# Patient Record
Sex: Male | Born: 1935 | Race: Black or African American | Hispanic: No | Marital: Single | Smoking: Former smoker
Health system: Southern US, Community
[De-identification: ages and names within clinical notes are randomized; demographics above are authoritative.]

## PROBLEM LIST (undated history)

## (undated) DIAGNOSIS — I639 Cerebral infarction, unspecified: Secondary | ICD-10-CM

## (undated) DIAGNOSIS — I219 Acute myocardial infarction, unspecified: Secondary | ICD-10-CM

## (undated) DIAGNOSIS — M109 Gout, unspecified: Secondary | ICD-10-CM

## (undated) DIAGNOSIS — F0390 Unspecified dementia without behavioral disturbance: Secondary | ICD-10-CM

## (undated) HISTORY — PX: BACK SURGERY: SHX140

---

## 1998-07-06 ENCOUNTER — Ambulatory Visit (HOSPITAL_BASED_OUTPATIENT_CLINIC_OR_DEPARTMENT_OTHER): Admission: RE | Admit: 1998-07-06 | Discharge: 1998-07-06 | Payer: Self-pay | Admitting: Urology

## 1998-12-10 ENCOUNTER — Encounter: Payer: Self-pay | Admitting: Emergency Medicine

## 1998-12-10 ENCOUNTER — Emergency Department (HOSPITAL_COMMUNITY): Admission: EM | Admit: 1998-12-10 | Discharge: 1998-12-10 | Payer: Self-pay | Admitting: Emergency Medicine

## 1998-12-22 ENCOUNTER — Ambulatory Visit (HOSPITAL_COMMUNITY): Admission: RE | Admit: 1998-12-22 | Discharge: 1998-12-22 | Payer: Self-pay | Admitting: *Deleted

## 1998-12-22 ENCOUNTER — Encounter: Payer: Self-pay | Admitting: *Deleted

## 1999-07-19 ENCOUNTER — Ambulatory Visit (HOSPITAL_COMMUNITY): Admission: RE | Admit: 1999-07-19 | Discharge: 1999-07-19 | Payer: Self-pay | Admitting: Gastroenterology

## 2000-07-05 ENCOUNTER — Ambulatory Visit (HOSPITAL_COMMUNITY): Admission: RE | Admit: 2000-07-05 | Discharge: 2000-07-05 | Payer: Self-pay | Admitting: Interventional Cardiology

## 2000-07-13 ENCOUNTER — Encounter: Payer: Self-pay | Admitting: Interventional Cardiology

## 2000-07-13 ENCOUNTER — Encounter: Admission: RE | Admit: 2000-07-13 | Discharge: 2000-07-13 | Payer: Self-pay | Admitting: Interventional Cardiology

## 2004-12-06 ENCOUNTER — Ambulatory Visit (HOSPITAL_COMMUNITY): Admission: RE | Admit: 2004-12-06 | Discharge: 2004-12-06 | Payer: Self-pay | Admitting: Gastroenterology

## 2005-05-26 ENCOUNTER — Inpatient Hospital Stay (HOSPITAL_BASED_OUTPATIENT_CLINIC_OR_DEPARTMENT_OTHER): Admission: RE | Admit: 2005-05-26 | Discharge: 2005-05-26 | Payer: Self-pay | Admitting: Interventional Cardiology

## 2013-01-22 ENCOUNTER — Other Ambulatory Visit: Payer: Self-pay | Admitting: Internal Medicine

## 2013-01-22 DIAGNOSIS — R269 Unspecified abnormalities of gait and mobility: Secondary | ICD-10-CM

## 2013-01-22 DIAGNOSIS — F0390 Unspecified dementia without behavioral disturbance: Secondary | ICD-10-CM

## 2013-01-23 ENCOUNTER — Ambulatory Visit
Admission: RE | Admit: 2013-01-23 | Discharge: 2013-01-23 | Disposition: A | Discharge status: Home / Self Care | Payer: Medicare Other | Source: Ambulatory Visit | Attending: Internal Medicine | Admitting: Internal Medicine

## 2013-01-23 DIAGNOSIS — R269 Unspecified abnormalities of gait and mobility: Secondary | ICD-10-CM

## 2013-01-23 DIAGNOSIS — F0390 Unspecified dementia without behavioral disturbance: Secondary | ICD-10-CM

## 2013-01-23 MED ORDER — GADOBENATE DIMEGLUMINE 529 MG/ML IV SOLN
14.0000 mL | Freq: Once | INTRAVENOUS | Status: AC | PRN
  Administered 2013-01-23: 14 mL via INTRAVENOUS

## 2013-05-27 ENCOUNTER — Emergency Department (HOSPITAL_COMMUNITY): Payer: Medicare Other

## 2013-05-27 ENCOUNTER — Inpatient Hospital Stay (HOSPITAL_COMMUNITY)
Admission: EM | Admit: 2013-05-27 | Discharge: 2013-05-31 | DRG: 309 | Disposition: A | Discharge status: Skilled Nursing Facility | Payer: Medicare Other | Attending: Internal Medicine | Admitting: Internal Medicine

## 2013-05-27 ENCOUNTER — Encounter (HOSPITAL_COMMUNITY): Payer: Self-pay | Admitting: Nurse Practitioner

## 2013-05-27 DIAGNOSIS — I480 Paroxysmal atrial fibrillation: Secondary | ICD-10-CM | POA: Diagnosis present

## 2013-05-27 DIAGNOSIS — N390 Urinary tract infection, site not specified: Secondary | ICD-10-CM | POA: Diagnosis present

## 2013-05-27 DIAGNOSIS — E119 Type 2 diabetes mellitus without complications: Secondary | ICD-10-CM

## 2013-05-27 DIAGNOSIS — Z8673 Personal history of transient ischemic attack (TIA), and cerebral infarction without residual deficits: Secondary | ICD-10-CM

## 2013-05-27 DIAGNOSIS — F0390 Unspecified dementia without behavioral disturbance: Secondary | ICD-10-CM | POA: Diagnosis present

## 2013-05-27 DIAGNOSIS — M25569 Pain in unspecified knee: Secondary | ICD-10-CM

## 2013-05-27 DIAGNOSIS — Z79899 Other long term (current) drug therapy: Secondary | ICD-10-CM

## 2013-05-27 DIAGNOSIS — I2510 Atherosclerotic heart disease of native coronary artery without angina pectoris: Secondary | ICD-10-CM | POA: Diagnosis present

## 2013-05-27 DIAGNOSIS — I4891 Unspecified atrial fibrillation: Secondary | ICD-10-CM

## 2013-05-27 DIAGNOSIS — M1990 Unspecified osteoarthritis, unspecified site: Secondary | ICD-10-CM

## 2013-05-27 DIAGNOSIS — B961 Klebsiella pneumoniae [K. pneumoniae] as the cause of diseases classified elsewhere: Secondary | ICD-10-CM | POA: Diagnosis present

## 2013-05-27 DIAGNOSIS — F0150 Vascular dementia without behavioral disturbance: Secondary | ICD-10-CM | POA: Diagnosis present

## 2013-05-27 DIAGNOSIS — M79671 Pain in right foot: Secondary | ICD-10-CM

## 2013-05-27 DIAGNOSIS — I672 Cerebral atherosclerosis: Secondary | ICD-10-CM | POA: Diagnosis present

## 2013-05-27 DIAGNOSIS — N400 Enlarged prostate without lower urinary tract symptoms: Secondary | ICD-10-CM | POA: Diagnosis present

## 2013-05-27 DIAGNOSIS — R739 Hyperglycemia, unspecified: Secondary | ICD-10-CM

## 2013-05-27 DIAGNOSIS — E162 Hypoglycemia, unspecified: Secondary | ICD-10-CM

## 2013-05-27 DIAGNOSIS — D638 Anemia in other chronic diseases classified elsewhere: Secondary | ICD-10-CM | POA: Diagnosis present

## 2013-05-27 DIAGNOSIS — I252 Old myocardial infarction: Secondary | ICD-10-CM

## 2013-05-27 DIAGNOSIS — Z7982 Long term (current) use of aspirin: Secondary | ICD-10-CM

## 2013-05-27 DIAGNOSIS — E1169 Type 2 diabetes mellitus with other specified complication: Secondary | ICD-10-CM | POA: Diagnosis present

## 2013-05-27 DIAGNOSIS — M109 Gout, unspecified: Secondary | ICD-10-CM | POA: Diagnosis present

## 2013-05-27 DIAGNOSIS — Z87891 Personal history of nicotine dependence: Secondary | ICD-10-CM

## 2013-05-27 DIAGNOSIS — Z23 Encounter for immunization: Secondary | ICD-10-CM

## 2013-05-27 DIAGNOSIS — E860 Dehydration: Secondary | ICD-10-CM

## 2013-05-27 DIAGNOSIS — Z7902 Long term (current) use of antithrombotics/antiplatelets: Secondary | ICD-10-CM

## 2013-05-27 DIAGNOSIS — E876 Hypokalemia: Secondary | ICD-10-CM | POA: Diagnosis not present

## 2013-05-27 DIAGNOSIS — I10 Essential (primary) hypertension: Secondary | ICD-10-CM | POA: Diagnosis present

## 2013-05-27 DIAGNOSIS — D72829 Elevated white blood cell count, unspecified: Secondary | ICD-10-CM | POA: Diagnosis present

## 2013-05-27 HISTORY — DX: Unspecified dementia, unspecified severity, without behavioral disturbance, psychotic disturbance, mood disturbance, and anxiety: F03.90

## 2013-05-27 HISTORY — DX: Acute myocardial infarction, unspecified: I21.9

## 2013-05-27 HISTORY — DX: Gout, unspecified: M10.9

## 2013-05-27 HISTORY — DX: Cerebral infarction, unspecified: I63.9

## 2013-05-27 LAB — COMPREHENSIVE METABOLIC PANEL
ALT: 20 U/L (ref 0–53)
AST: 27 U/L (ref 0–37)
Albumin: 3.2 g/dL — ABNORMAL LOW (ref 3.5–5.2)
Alkaline Phosphatase: 84 U/L (ref 39–117)
Calcium: 10.8 mg/dL — ABNORMAL HIGH (ref 8.4–10.5)
Potassium: 4 mEq/L (ref 3.5–5.1)
Sodium: 141 mEq/L (ref 135–145)
Total Protein: 7.3 g/dL (ref 6.0–8.3)

## 2013-05-27 LAB — CBC WITH DIFFERENTIAL/PLATELET
Basophils Absolute: 0 10*3/uL (ref 0.0–0.1)
Eosinophils Absolute: 0.1 10*3/uL (ref 0.0–0.7)
Eosinophils Relative: 1 % (ref 0–5)
Lymphs Abs: 1.4 10*3/uL (ref 0.7–4.0)
MCH: 28.4 pg (ref 26.0–34.0)
MCV: 83.1 fL (ref 78.0–100.0)
Neutrophils Relative %: 80 % — ABNORMAL HIGH (ref 43–77)
Platelets: 368 10*3/uL (ref 150–400)
RBC: 4.15 MIL/uL — ABNORMAL LOW (ref 4.22–5.81)
RDW: 15.4 % (ref 11.5–15.5)
WBC: 10.6 10*3/uL — ABNORMAL HIGH (ref 4.0–10.5)

## 2013-05-27 MED ORDER — SODIUM CHLORIDE 0.9 % IV SOLN
1000.0000 mL | Freq: Once | INTRAVENOUS | Status: AC
  Administered 2013-05-27: 1000 mL via INTRAVENOUS

## 2013-05-27 NOTE — ED Provider Notes (Addendum)
CSN: [id number]     Arrival date & time 05/27/13  1805 History   First MD Initiated Contact with Patient 05/27/13 1811     Chief Complaint  Patient presents with  . Foot Pain  . Diarrhea   (Consider location/radiation/quality/duration/timing/severity/associated sxs/prior Treatment) HPI Comments: Darren Underwood is a 79 y.o. Male who presents for evaluation of right knee and foot pain, diarrhea, inability to walk, and decreased appetite for 10 days. He is here with his wife and daughter. They state that he cannot be managed at home like this. He lives alone with his wife, who is elderly. He saw his PCP, and was placed on prednisone for "gout". He completed this treatment 3 days ago. He is taking Tylenol without relief of pain. There is no reported fever or chills, nausea, vomiting, or stated abdominal pain. The patient cannot contribute to history.  Level V caveat: Dementia   Patient is a 79 y.o. male presenting with lower extremity pain and diarrhea. The history is provided by the patient, the spouse and a relative.  Foot Pain  Diarrhea   Past Medical History  Diagnosis Date  . Gout   . Stroke   . Dementia   . Myocardial infarct    History reviewed. No pertinent past surgical history. History reviewed. No pertinent family history. History  Substance Use Topics  . Smoking status: Not on file  . Smokeless tobacco: Not on file  . Alcohol Use: Not on file    Review of Systems  Unable to perform ROS Gastrointestinal: Positive for diarrhea.    Allergies  Review of patient's allergies indicates no known allergies.  Home Medications   Current Outpatient Rx  Name  Route  Sig  Dispense  Refill  . amLODipine (NORVASC) 10 MG tablet   Oral   Take 10 mg by mouth daily.         Marland Kitchen aspirin EC 81 MG tablet   Oral   Take 81 mg by mouth daily.         Marland Kitchen atorvastatin (LIPITOR) 20 MG tablet   Oral   Take 10 mg by mouth daily.         . carvedilol (COREG) 12.5 MG tablet    Oral   Take 12.5 mg by mouth 2 (two) times daily with a meal.         . clopidogrel (PLAVIX) 75 MG tablet   Oral   Take 75 mg by mouth daily.         Marland Kitchen donepezil (ARICEPT) 10 MG tablet   Oral   Take 10 mg by mouth at bedtime.         Marland Kitchen glimepiride (AMARYL) 4 MG tablet   Oral   Take 4 mg by mouth daily before breakfast.         . metFORMIN (GLUCOPHAGE) 850 MG tablet   Oral   Take 850 mg by mouth 2 (two) times daily with a meal.         . olmesartan-hydrochlorothiazide (BENICAR HCT) 40-25 MG per tablet   Oral   Take 0.5 tablets by mouth daily.         . pioglitazone (ACTOS) 45 MG tablet   Oral   Take 45 mg by mouth daily.         . potassium chloride SA (K-DUR,KLOR-CON) 20 MEQ tablet   Oral   Take 20 mEq by mouth 2 (two) times daily.         . traMADol (  ULTRAM) 50 MG tablet   Oral   Take 50 mg by mouth every 6 (six) hours as needed. For pain          BP 145/77  Pulse 80  Resp 12  SpO2 99% Physical Exam  Nursing note and vitals reviewed. Constitutional: He is oriented to person, place, and time. He appears well-developed.  Elderly, frail. He cannot sit up without assistance due to weakness.  HENT:  Head: Normocephalic and atraumatic.  Right Ear: External ear normal.  Left Ear: External ear normal.  Eyes: Conjunctivae and EOM are normal. Pupils are equal, round, and reactive to light.  Neck: Normal range of motion and phonation normal. Neck supple.  Cardiovascular: Normal rate, regular rhythm, normal heart sounds and intact distal pulses.   Pulmonary/Chest: Effort normal and breath sounds normal. He exhibits no bony tenderness.  Abdominal: Soft. Normal appearance. He exhibits no mass. There is no tenderness. There is no guarding.  Musculoskeletal:  Decreased range of motion right knee, and right foot, secondary to pain. There is mild effusion right knee. Right knee is grossly stable. Right foot is mildly swollen, forefoot, with increased warmth, and  is tender to touch. There is no significant erythema or proximal streaking.  Neurological: He is alert and oriented to person, place, and time. He has normal strength. No cranial nerve deficit or sensory deficit. He exhibits normal muscle tone. Coordination normal.  Skin: Skin is warm, dry and intact.  Psychiatric: He has a normal mood and affect. His behavior is normal. Judgment and thought content normal.    ED Course  Procedures (including critical care time)  Medications  0.9 %  sodium chloride infusion (1,000 mLs Intravenous New Bag/Given 05/27/13 1859)   Patient Vitals for the past 24 hrs:  BP Pulse Resp SpO2  05/27/13 2315 145/77 mmHg 80 12 99 %  05/27/13 2300 147/77 mmHg 90 20 98 %  05/27/13 2215 145/65 mmHg 78 16 98 %  05/27/13 2200 166/86 mmHg 81 16 100 %  05/27/13 2145 153/63 mmHg 82 16 98 %  05/27/13 2115 150/77 mmHg 91 16 99 %  05/27/13 2100 146/70 mmHg 77 22 97 %  05/27/13 2052 - 110 24 98 %  05/27/13 2051 168/69 mmHg - - -  05/27/13 2022 145/73 mmHg 82 16 100 %  05/27/13 1925 143/79 mmHg 92 - 96 %    11:29 PM-Consult complete with hospitalist. Patient case explained and discussed. He agrees to admit patient for further evaluation and treatment. Call ended at 0010  0055- I discussed the case again with Dr. Julian Reil; relative to the new-onset atrial fibrillation. He asked that I start heparin.   Date: 05/28/13  Rate: 108  Rhythm: atrial fibrillation  QRS Axis: normal  PR and QT Intervals: QT normal  ST/T Wave abnormalities: normal  PR and QRS Conduction Disutrbances:Normal QT  Narrative Interpretation:   Old EKG Reviewed: no old ECG  CRITICAL CARE Performed by: Mancel Bale L Total critical care time: 35 minutes  Critical care time was exclusive of separately billable procedures and treating other patients. Critical care was necessary to treat or prevent imminent or life-threatening deterioration. Critical care was time spent personally by me on the following  activities: development of treatment plan with patient and/or surrogate as well as nursing, discussions with consultants, evaluation of patient's response to treatment, examination of patient, obtaining history from patient or surrogate, ordering and performing treatments and interventions, ordering and review of laboratory studies, ordering and review of radiographic studies,  pulse oximetry and re-evaluation of patient's condition.   Labs Review Labs Reviewed  CBC WITH DIFFERENTIAL - Abnormal; Notable for the following:    WBC 10.6 (*)    RBC 4.15 (*)    Hemoglobin 11.8 (*)    HCT 34.5 (*)    Neutrophils Relative % 80 (*)    Neutro Abs 8.4 (*)    All other components within normal limits  COMPREHENSIVE METABOLIC PANEL - Abnormal; Notable for the following:    Glucose, Bld 321 (*)    BUN 37 (*)    Calcium 10.8 (*)    Albumin 3.2 (*)    GFR calc non Af Amer 55 (*)    GFR calc Af Amer 64 (*)    All other components within normal limits  LIPASE, BLOOD  URINALYSIS, ROUTINE W REFLEX MICROSCOPIC   Imaging Review Dg Knee 1-2 Views Left  05/27/2013   CLINICAL DATA:  Pain. Possible gout. No trauma.  EXAM: LEFT KNEE - 1-2 VIEW  COMPARISON:  None.  FINDINGS: Negative for acute fracture, subluxation, joint effusion, or osseous erosion. There is extensive atherosclerotic calcification. Mild degenerative medial compartment joint narrowing. 7 mm long oval ossification or other mineralization near the fibular head. This may be bursal or tendinous. Location not typical for gout.  IMPRESSION: 1. Negative for acute osseous abnormality. No evidence of erosive arthritis. 2. Nonspecific ossification near the fibular head, likely bursal.   Electronically Signed   By: Tiburcio Pea   On: 05/27/2013 23:10   Dg Knee 1-2 Views Right  05/27/2013   CLINICAL DATA:  Pain. Possible gout. No trauma.  EXAM: RIGHT KNEE - 1-2 VIEW  COMPARISON:  None.  FINDINGS: Negative for acute fracture, subluxation, erosion, or other  acute abnormality. Doubt significant knee joint effusion. Tricompartmental osteoarthritis, more advanced than the contralateral knee, with moderate to advanced medial compartment narrowing. Extensive atherosclerotic calcification.  IMPRESSION: 1. Negative for acute osseous abnormality. 2. Tricompartmental osteoarthritis with at least moderate medial compartment narrowing.   Electronically Signed   By: Tiburcio Pea   On: 05/27/2013 23:11   Dg Foot Complete Right  05/28/2013   CLINICAL DATA:  Foot pain and diarrhea.  EXAM: RIGHT FOOT COMPLETE - 3+ VIEW  COMPARISON:  None.  FINDINGS: Negative for fracture subluxation. No osseous erosion. Osteopenia. 1st MCP joint osteoarthritis with mild joint narrowing. Talonavicular joint degenerative change with anterior process talus large osteophyte.  IMPRESSION: Negative for acute osseous abnormality.   Electronically Signed   By: Tiburcio Pea   On: 05/28/2013 00:01    MDM   1. Dehydration   2. Hyperglycemia   3. Knee pain, unspecified laterality   4. Degenerative joint disease   5. Foot pain, right   6. New onset atrial fibrillation       Right knee pain, secondary to arthritis. Generalized malaise, and weakness, associated with decreased oral intake. Metabolic screening, consistent with dehydration, and hyperglycemia. Hyperglycemia may be due to recent treatment with prednisone. This could have caused dehydration. The patient had transient tachycardia in ED, treated with IV fluids. He is weak. He needs admission for observation, further IV fluids, and further evaluation.  Nursing Notes Reviewed/ Care Coordinated, and agree without changes. Applicable Imaging Reviewed.  Interpretation of Laboratory Data incorporated into ED treatment  Plan: Admit  Flint Melter, MD 05/28/13 0011  Flint Melter, MD 05/28/13 7829  Flint Melter, MD 05/28/13 [phone]

## 2013-05-27 NOTE — ED Notes (Signed)
Family called ems for gout pain in R foot for past 10 days. PCP prescribed pain medication that upset stomach so he could not tolerate. Alert but baseline dementia. VSS

## 2013-05-27 NOTE — ED Notes (Signed)
Patient HR in 130's for approx 1 minute. Dr. Effie Shy made aware and received verbal order to give patient bolus of normal saline. HR currently 90 and sinus rhythm. Pt resting with eyes closed. Respirations even and unlabored. No acute distress noted.

## 2013-05-27 NOTE — ED Notes (Signed)
Pt had gout flare-up on Sept 5th, saw physician on the 8th of September; was given prednisone for swelling and inflammation of right foot.  Pt has had diarrhea since that office visit.  No appetite and swelling of the right foot has returned and additionally, right knee.  Pt had shaking episode over this past weekend.

## 2013-05-28 ENCOUNTER — Encounter (HOSPITAL_COMMUNITY): Payer: Self-pay | Admitting: *Deleted

## 2013-05-28 DIAGNOSIS — F0390 Unspecified dementia without behavioral disturbance: Secondary | ICD-10-CM | POA: Diagnosis present

## 2013-05-28 DIAGNOSIS — E860 Dehydration: Secondary | ICD-10-CM

## 2013-05-28 DIAGNOSIS — I4891 Unspecified atrial fibrillation: Principal | ICD-10-CM

## 2013-05-28 DIAGNOSIS — E162 Hypoglycemia, unspecified: Secondary | ICD-10-CM

## 2013-05-28 DIAGNOSIS — E876 Hypokalemia: Secondary | ICD-10-CM

## 2013-05-28 DIAGNOSIS — M109 Gout, unspecified: Secondary | ICD-10-CM

## 2013-05-28 DIAGNOSIS — E119 Type 2 diabetes mellitus without complications: Secondary | ICD-10-CM

## 2013-05-28 DIAGNOSIS — I10 Essential (primary) hypertension: Secondary | ICD-10-CM

## 2013-05-28 DIAGNOSIS — I480 Paroxysmal atrial fibrillation: Secondary | ICD-10-CM | POA: Diagnosis present

## 2013-05-28 DIAGNOSIS — N390 Urinary tract infection, site not specified: Secondary | ICD-10-CM

## 2013-05-28 DIAGNOSIS — D72829 Elevated white blood cell count, unspecified: Secondary | ICD-10-CM

## 2013-05-28 LAB — GLUCOSE, CAPILLARY
Glucose-Capillary: 67 mg/dL — ABNORMAL LOW (ref 70–99)
Glucose-Capillary: 100 mg/dL — ABNORMAL HIGH (ref 70–99)
Glucose-Capillary: 260 mg/dL — ABNORMAL HIGH (ref 70–99)

## 2013-05-28 LAB — CBC
HCT: 37 % — ABNORMAL LOW (ref 39.0–52.0)
HCT: 33.7 % — ABNORMAL LOW (ref 39.0–52.0)
Hemoglobin: 12.2 g/dL — ABNORMAL LOW (ref 13.0–17.0)
MCH: 27.9 pg (ref 26.0–34.0)
MCH: 28.5 pg (ref 26.0–34.0)
MCHC: 33 g/dL (ref 30.0–36.0)
MCV: 84.5 fL (ref 78.0–100.0)
MCV: 83.6 fL (ref 78.0–100.0)
RBC: 4.38 MIL/uL (ref 4.22–5.81)
RBC: 4.03 MIL/uL — ABNORMAL LOW (ref 4.22–5.81)
WBC: 11.6 10*3/uL — ABNORMAL HIGH (ref 4.0–10.5)

## 2013-05-28 LAB — BASIC METABOLIC PANEL
BUN: 29 mg/dL — ABNORMAL HIGH (ref 6–23)
CO2: 26 mEq/L (ref 19–32)
Calcium: 10.2 mg/dL (ref 8.4–10.5)
Creatinine, Ser: 0.95 mg/dL (ref 0.50–1.35)
Glucose, Bld: 155 mg/dL — ABNORMAL HIGH (ref 70–99)
Sodium: 143 mEq/L (ref 135–145)

## 2013-05-28 LAB — LIPID PANEL
Cholesterol: 123 mg/dL (ref 0–200)
HDL: 39 mg/dL — ABNORMAL LOW (ref >39)
Total CHOL/HDL Ratio: 3.2 RATIO
Triglycerides: 98 mg/dL (ref <150)
VLDL: 20 mg/dL (ref 0–40)

## 2013-05-28 LAB — URINE MICROSCOPIC-ADD ON

## 2013-05-28 LAB — URINALYSIS, ROUTINE W REFLEX MICROSCOPIC
Bilirubin Urine: NEGATIVE
Glucose, UA: 250 mg/dL — AB
Specific Gravity, Urine: 1.014 (ref 1.005–1.030)
Urobilinogen, UA: 0.2 mg/dL (ref 0.0–1.0)
pH: 5 (ref 5.0–8.0)

## 2013-05-28 LAB — HEMOGLOBIN A1C: Mean Plasma Glucose: 180 mg/dL — ABNORMAL HIGH (ref <117)

## 2013-05-28 LAB — TSH: TSH: 2.542 u[IU]/mL (ref 0.350–4.500)

## 2013-05-28 LAB — TROPONIN I: Troponin I: <0.3 ng/mL (ref <0.30)

## 2013-05-28 MED ORDER — SODIUM CHLORIDE 0.9 % IJ SOLN
3.0000 mL | Freq: Two times a day (BID) | INTRAMUSCULAR | Status: DC
  Administered 2013-05-28 – 2013-05-30 (×3): 3 mL via INTRAVENOUS

## 2013-05-28 MED ORDER — DILTIAZEM HCL ER 60 MG PO CP12
60.0000 mg | ORAL_CAPSULE | Freq: Two times a day (BID) | ORAL | Status: DC
  Administered 2013-05-28 (×2): 60 mg via ORAL
  Filled 2013-05-28 (×4): qty 1

## 2013-05-28 MED ORDER — GLUCERNA SHAKE PO LIQD
237.0000 mL | Freq: Two times a day (BID) | ORAL | Status: DC
  Administered 2013-05-29 – 2013-05-31 (×4): 237 mL via ORAL

## 2013-05-28 MED ORDER — POTASSIUM CHLORIDE CRYS ER 20 MEQ PO TBCR
40.0000 meq | EXTENDED_RELEASE_TABLET | Freq: Two times a day (BID) | ORAL | Status: AC
  Administered 2013-05-28 (×2): 40 meq via ORAL
  Filled 2013-05-28 (×2): qty 2

## 2013-05-28 MED ORDER — TRAMADOL HCL 50 MG PO TABS: 50.0000 mg | ORAL_TABLET | Freq: Four times a day (QID) | ORAL | Status: DC | PRN

## 2013-05-28 MED ORDER — ENSURE PUDDING PO PUDG
1.0000 | ORAL | Status: DC
  Administered 2013-05-28 – 2013-05-30 (×3): 1 via ORAL

## 2013-05-28 MED ORDER — METOPROLOL TARTRATE 1 MG/ML IV SOLN: 5.0000 mg | INTRAVENOUS | Status: DC | PRN

## 2013-05-28 MED ORDER — HYDROCHLOROTHIAZIDE 12.5 MG PO CAPS
12.5000 mg | ORAL_CAPSULE | Freq: Every day | ORAL | Status: DC
  Administered 2013-05-28 – 2013-05-31 (×4): 12.5 mg via ORAL
  Filled 2013-05-28 (×4): qty 1

## 2013-05-28 MED ORDER — CARVEDILOL 12.5 MG PO TABS
12.5000 mg | ORAL_TABLET | Freq: Two times a day (BID) | ORAL | Status: DC
  Administered 2013-05-28: 12.5 mg via ORAL
  Filled 2013-05-28 (×3): qty 1

## 2013-05-28 MED ORDER — ASPIRIN EC 81 MG PO TBEC
81.0000 mg | DELAYED_RELEASE_TABLET | Freq: Every day | ORAL | Status: DC
  Administered 2013-05-28 – 2013-05-31 (×4): 81 mg via ORAL
  Filled 2013-05-28 (×4): qty 1

## 2013-05-28 MED ORDER — INFLUENZA VAC SPLIT QUAD 0.5 ML IM SUSP
0.5000 mL | INTRAMUSCULAR | Status: AC
  Filled 2013-05-28: qty 0.5

## 2013-05-28 MED ORDER — DEXTROSE 5 % IV SOLN
1.0000 g | INTRAVENOUS | Status: DC
  Administered 2013-05-28 – 2013-05-30 (×3): 1 g via INTRAVENOUS
  Filled 2013-05-28 (×3): qty 10

## 2013-05-28 MED ORDER — INSULIN ASPART 100 UNIT/ML ~~LOC~~ SOLN
0.0000 [IU] | Freq: Three times a day (TID) | SUBCUTANEOUS | Status: DC
  Administered 2013-05-28: 11 [IU] via SUBCUTANEOUS
  Administered 2013-05-28: 8 [IU] via SUBCUTANEOUS

## 2013-05-28 MED ORDER — AMLODIPINE BESYLATE 10 MG PO TABS
10.0000 mg | ORAL_TABLET | Freq: Every day | ORAL | Status: DC
  Filled 2013-05-28: qty 1

## 2013-05-28 MED ORDER — HEPARIN BOLUS VIA INFUSION
4000.0000 [IU] | Freq: Once | INTRAVENOUS | Status: AC
  Administered 2013-05-28: 4000 [IU] via INTRAVENOUS
  Filled 2013-05-28: qty 4000

## 2013-05-28 MED ORDER — CARVEDILOL 12.5 MG PO TABS
12.5000 mg | ORAL_TABLET | Freq: Once | ORAL | Status: AC
  Administered 2013-05-28: 08:00:00 12.5 mg via ORAL
  Filled 2013-05-28: qty 1

## 2013-05-28 MED ORDER — HEPARIN (PORCINE) IN NACL 100-0.45 UNIT/ML-% IJ SOLN
1000.0000 [IU]/h | INTRAMUSCULAR | Status: DC
  Administered 2013-05-28 – 2013-05-29 (×2): 1000 [IU]/h via INTRAVENOUS
  Filled 2013-05-28 (×2): qty 250

## 2013-05-28 MED ORDER — INSULIN ASPART 100 UNIT/ML ~~LOC~~ SOLN
0.0000 [IU] | Freq: Three times a day (TID) | SUBCUTANEOUS | Status: DC
  Administered 2013-05-29: 5 [IU] via SUBCUTANEOUS

## 2013-05-28 MED ORDER — CARVEDILOL 25 MG PO TABS
25.0000 mg | ORAL_TABLET | Freq: Two times a day (BID) | ORAL | Status: DC
  Administered 2013-05-28 – 2013-05-31 (×6): 25 mg via ORAL
  Filled 2013-05-28 (×8): qty 1

## 2013-05-28 MED ORDER — POTASSIUM CHLORIDE 10 MEQ/100ML IV SOLN
10.0000 meq | INTRAVENOUS | Status: AC
  Administered 2013-05-28 (×2): 10 meq via INTRAVENOUS
  Filled 2013-05-28 (×2): qty 100

## 2013-05-28 MED ORDER — ATORVASTATIN CALCIUM 10 MG PO TABS
10.0000 mg | ORAL_TABLET | Freq: Every day | ORAL | Status: DC
  Administered 2013-05-28 – 2013-05-31 (×4): 10 mg via ORAL
  Filled 2013-05-28 (×4): qty 1

## 2013-05-28 MED ORDER — OLMESARTAN MEDOXOMIL-HCTZ 40-25 MG PO TABS: 0.5000 | ORAL_TABLET | Freq: Every day | ORAL | Status: DC

## 2013-05-28 MED ORDER — CLOPIDOGREL BISULFATE 75 MG PO TABS
75.0000 mg | ORAL_TABLET | Freq: Every day | ORAL | Status: DC
  Administered 2013-05-28 – 2013-05-31 (×4): 75 mg via ORAL
  Filled 2013-05-28 (×5): qty 1

## 2013-05-28 MED ORDER — SODIUM CHLORIDE 0.9 % IV SOLN
INTRAVENOUS | Status: AC
  Administered 2013-05-28: 03:00:00 via INTRAVENOUS

## 2013-05-28 MED ORDER — IRBESARTAN 150 MG PO TABS
150.0000 mg | ORAL_TABLET | Freq: Every day | ORAL | Status: DC
  Administered 2013-05-28 – 2013-05-31 (×4): 150 mg via ORAL
  Filled 2013-05-28 (×4): qty 1

## 2013-05-28 MED ORDER — MAGNESIUM SULFATE 40 MG/ML IJ SOLN
4.0000 g | Freq: Once | INTRAMUSCULAR | Status: AC
  Administered 2013-05-28: 4 g via INTRAVENOUS
  Filled 2013-05-28 (×2): qty 100

## 2013-05-28 MED ORDER — HYDROMORPHONE HCL PF 1 MG/ML IJ SOLN
0.5000 mg | Freq: Once | INTRAMUSCULAR | Status: AC
  Administered 2013-05-28: 0.5 mg via INTRAVENOUS
  Filled 2013-05-28: qty 1

## 2013-05-28 MED ORDER — HYDROMORPHONE HCL PF 1 MG/ML IJ SOLN: 0.5000 mg | INTRAMUSCULAR | Status: DC | PRN

## 2013-05-28 MED ORDER — DONEPEZIL HCL 10 MG PO TABS
10.0000 mg | ORAL_TABLET | Freq: Every day | ORAL | Status: DC
  Administered 2013-05-28 – 2013-05-30 (×3): 10 mg via ORAL
  Filled 2013-05-28 (×4): qty 1

## 2013-05-28 NOTE — Progress Notes (Signed)
Pt had a 4-beat run of VTach and then 1 beat of SR and then a 6-beat run of VTach. RN went to check on patient. Pt is sleeping comfortably. RN woke up patient. Pt had no complaints of chest pain and SOB. Dr. Malachi Bonds was made aware of VTach. No new orders received at this time. Will continue to monitor

## 2013-05-28 NOTE — Progress Notes (Signed)
Utilization Review Completed Janziel Hockett J. Zaliyah Meikle, RN, BSN, NCM [phone]  

## 2013-05-28 NOTE — Progress Notes (Signed)
Pt HR in 70-80s SR with PACs this shift. Previous shifts per RN and MD note pt has been in A.fib RVR at times and converted to SR. Will continue to monitor. Baron Hamper, RN

## 2013-05-28 NOTE — Progress Notes (Signed)
Pt is alert and oriented x3. Pt is not complaining any pain. Will continue to monitor

## 2013-05-28 NOTE — H&P (Signed)
[facility] History and Physical  DECKER COGDELL NWG:[id number] DOB: 1933/06/14 DOA: 05/27/2013  Referring physician: ED PCP: Lillia Mountain, MD   Chief Complaint: Gout flare, diarrhea  HPI: Darren Underwood is a 79 y.o. male who presents for evaluation of right knee and foot pain, diarrhea, inability to walk, and decreased appetite for 10 days. He is here with his wife and daughter. They state that he cannot be managed at home like this. He lives alone with his wife, who is elderly. He saw his PCP, and was placed on prednisone for gout. He completed this treatment 3 days ago. He is taking Tylenol without relief of pain. There is no reported fever or chills, nausea, vomiting.  Work up in the ED revealed a UTI which the patient has been placed on rocephin for, he also has been noted to have episodic A.Fib with RVR (RVR up to the 150s witnessed by myself on the floor although he is asymptomatic with respect to this).   Review of Systems: 12 systems reviewed and otherwise negative.  Past Medical History  Diagnosis Date  . Gout   . Stroke   . Dementia   . Myocardial infarct    Past Surgical History  Procedure Laterality Date  . Back surgery     Social History:  reports that he quit smoking about 30 years ago. He does not have any smokeless tobacco history on file. He reports that he does not drink alcohol or use illicit drugs.   No Known Allergies  History reviewed. No pertinent family history.  Prior to Admission medications   Medication Sig Start Date End Date Taking? Authorizing Provider  amLODipine (NORVASC) 10 MG tablet Take 10 mg by mouth daily.   Yes Historical Provider, MD  aspirin EC 81 MG tablet Take 81 mg by mouth daily.   Yes Historical Provider, MD  atorvastatin (LIPITOR) 20 MG tablet Take 10 mg by mouth daily.   Yes Historical Provider, MD  carvedilol (COREG) 12.5 MG tablet Take 12.5 mg by mouth 2 (two) times daily with a meal.   Yes Historical Provider, MD   clopidogrel (PLAVIX) 75 MG tablet Take 75 mg by mouth daily.   Yes Historical Provider, MD  donepezil (ARICEPT) 10 MG tablet Take 10 mg by mouth at bedtime.   Yes Historical Provider, MD  glimepiride (AMARYL) 4 MG tablet Take 4 mg by mouth daily before breakfast.   Yes Historical Provider, MD  metFORMIN (GLUCOPHAGE) 850 MG tablet Take 850 mg by mouth 2 (two) times daily with a meal.   Yes Historical Provider, MD  olmesartan-hydrochlorothiazide (BENICAR HCT) 40-25 MG per tablet Take 0.5 tablets by mouth daily.   Yes Historical Provider, MD  pioglitazone (ACTOS) 45 MG tablet Take 45 mg by mouth daily.   Yes Historical Provider, MD  potassium chloride SA (K-DUR,KLOR-CON) 20 MEQ tablet Take 20 mEq by mouth 2 (two) times daily.   Yes Historical Provider, MD  traMADol (ULTRAM) 50 MG tablet Take 50 mg by mouth every 6 (six) hours as needed. For pain 05/17/13  Yes Historical Provider, MD   Physical Exam: Filed Vitals:   05/28/13 0210  BP: 144/66  Pulse: 81  Temp: 98.5 F (36.9 C)  Resp: 18    General:  NAD, resting comfortably in bed, elderly and frail Eyes: PEERLA EOMI ENT: mucous membranes moist Neck: supple w/o JVD Cardiovascular: RRR w/o MRG Respiratory: CTA B Abdomen: soft, nt, nd, bs+ Skin: no rash nor lesion Musculoskeletal: MAE, decreased ROM  R knee and R foot due to pain Psychiatric: normal tone and affect Neurologic: AAOx3, grossly non-focal  Labs on Admission:  Basic Metabolic Panel:  Recent Labs Lab 05/27/13 1954  NA 141  K 4.0  CL 101  CO2 29  GLUCOSE 321*  BUN 37*  CREATININE 1.22  CALCIUM 10.8*   Liver Function Tests:  Recent Labs Lab 05/27/13 1954  AST 27  ALT 20  ALKPHOS 84  BILITOT 0.4  PROT 7.3  ALBUMIN 3.2*    Recent Labs Lab 05/27/13 1954  LIPASE 36   No results found for this basename: AMMONIA,  in the last 168 hours CBC:  Recent Labs Lab 05/27/13 1954  WBC 10.6*  NEUTROABS 8.4*  HGB 11.8*  HCT 34.5*  MCV 83.1  PLT 368    Cardiac Enzymes: No results found for this basename: CKTOTAL, CKMB, CKMBINDEX, TROPONINI,  in the last 168 hours  BNP (last 3 results) No results found for this basename: PROBNP,  in the last 8760 hours CBG: No results found for this basename: GLUCAP,  in the last 168 hours  Radiological Exams on Admission: Dg Knee 1-2 Views Left  05/27/2013   CLINICAL DATA:  Pain. Possible gout. No trauma.  EXAM: LEFT KNEE - 1-2 VIEW  COMPARISON:  None.  FINDINGS: Negative for acute fracture, subluxation, joint effusion, or osseous erosion. There is extensive atherosclerotic calcification. Mild degenerative medial compartment joint narrowing. 7 mm long oval ossification or other mineralization near the fibular head. This may be bursal or tendinous. Location not typical for gout.  IMPRESSION: 1. Negative for acute osseous abnormality. No evidence of erosive arthritis. 2. Nonspecific ossification near the fibular head, likely bursal.   Electronically Signed   By: Tiburcio Pea   On: 05/27/2013 23:10   Dg Knee 1-2 Views Right  05/27/2013   CLINICAL DATA:  Pain. Possible gout. No trauma.  EXAM: RIGHT KNEE - 1-2 VIEW  COMPARISON:  None.  FINDINGS: Negative for acute fracture, subluxation, erosion, or other acute abnormality. Doubt significant knee joint effusion. Tricompartmental osteoarthritis, more advanced than the contralateral knee, with moderate to advanced medial compartment narrowing. Extensive atherosclerotic calcification.  IMPRESSION: 1. Negative for acute osseous abnormality. 2. Tricompartmental osteoarthritis with at least moderate medial compartment narrowing.   Electronically Signed   By: Tiburcio Pea   On: 05/27/2013 23:11   Dg Foot Complete Right  05/28/2013   CLINICAL DATA:  Foot pain and diarrhea.  EXAM: RIGHT FOOT COMPLETE - 3+ VIEW  COMPARISON:  None.  FINDINGS: Negative for fracture subluxation. No osseous erosion. Osteopenia. 1st MCP joint osteoarthritis with mild joint narrowing.  Talonavicular joint degenerative change with anterior process talus large osteophyte.  IMPRESSION: Negative for acute osseous abnormality.   Electronically Signed   By: Tiburcio Pea   On: 05/28/2013 00:01    EKG: Independently reviewed.  Assessment/Plan Principal Problem:   Paroxysmal a-fib Active Problems:   Gout attack   Dementia   UTI (urinary tract infection)   1. Paroxysmal A.Fib with RVR -  Heparin gtt started for A.Fib but episodes are too short to require rate control at this time (patient converts out of A.Fib back to NSR before we can even order rate control meds).  Continue to monitor, long term anticoagulation decision up to PCP, patient certainly has the risk factors with positive history of stroke, but may or may not be at a significant enough fall risk to contraindicate this. 2. UTI - treating with emperic rocephin, cultures ordered and  pending 3. Gout - treating with dilaudid for pain control, just had course of prednisone finished at home, not clear how much is still acute flare vs weakness due to UTI. 4. DM2 - holding home meds and putting on SSI, control likely more poor due to prednisone    Code Status: Full Code (must indicate code status--if unknown or must be presumed, indicate so) Family Communication: No family in room (indicate person spoken with, if applicable, with phone number if by telephone) Disposition Plan: Admit to obs (indicate anticipated LOS)  Time spent: 70 min  Nayel Purdy M. [facility] Pager [phone]  If 7PM-7AM, please contact night-coverage [email or web address] Password [email or web address] 05/28/2013, 5:00 AM

## 2013-05-28 NOTE — Progress Notes (Addendum)
[facility] PROGRESS NOTE  Darren Underwood ZOX:[id number] DOB: 1933-05-31 DOA: 05/27/2013 PCP: Lillia Mountain, MD  Assessment/Plan  Paroxysmal atrial fibrillation with RVR.  Rate increased to the 170-180 this morning and would come down to the 140s 150s but remained elevated.  Patient asymptomatic and BP stable.   -  Continue heparin drip -  Continued IV fluids -  Discontinued amlodipine -  Started diltiazem 12 hour capsules 60 mg twice daily -  Increased carvedilol to 25 mg and gave an additional dose this morning -  TSH, troponins -  Echocardiogram -  Consider cardiology consult for possible rhythm control if this persists  CAD, continue aspirin, plavix, statin, and BB  HTN, blood pressure stable -  Start dilt, increase carvedilol  Urinary tract infection.   Continue ceftriaxone and followup culture  Gout flare. Recently completed prednisone. Continue when necessary Dilaudid.  Physical therapy recommending skilled nursing facility  Diabetes mellitus type 2.  Low dose SSI  Leukocytosis likely related to UTI.  Trend Anemia of chronic disease, hgb stable.    Hypokalemia.  Given oral and IV KCl Hypomagnesemia.  Magnesium 4gm IV once Hypoglycemia due to illness and decreased PO intake.  Decrease to low dose SSI.    Diet:  Diabetic diet Access:  PIV IVF:  OFF Proph:  heparin  Code Status: full Family Communication: patient alone Disposition Plan: SNF recommended by PT, but family wants home.  Would like hospital bed.    Consultants:  none  Procedures:  ECHO  Antibiotics:  Ceftriaxone    HPI/Subjective:  Feels sleepy.  Denies CP, Lightheadedness, SOB, nausea, and vomiting.    Objective: Filed Vitals:   05/28/13 0633 05/28/13 0744 05/28/13 1346 05/28/13 1636  BP: 140/87 149/72 142/71 146/72  Pulse: 101 70 78 82  Temp: 98.4 F (36.9 C)  97.9 F (36.6 C)   TempSrc: Oral  Oral   Resp: 18  18   Height:      Weight:      SpO2: 98%  97%      Intake/Output Summary (Last 24 hours) at 05/28/13 1715 Last data filed at 05/28/13 1252  Gross per 24 hour  Intake    118 ml  Output      0 ml  Net    118 ml   Filed Weights   05/27/13 2330 05/28/13 0210  Weight: 72.576 kg (160 lb) 64.7 kg (142 lb 10.2 oz)    Exam:   General:  Thin AAM, No acute distress  HEENT:  NCAT, MMM  Cardiovascular:  IRRR and tachycardic to 150s, nl S1, S2 no mrg, 2+ pulses, warm extremities  Respiratory:  CTAB, no increased WOB   Abdomen:   NABS, soft, NT/ND, full feeling  MSK:   Normal tone and bulk, no LEE  Neuro:  Grossly intact  Data Reviewed: Basic Metabolic Panel:  Recent Labs Lab 05/27/13 1954 05/28/13 0810  NA 141 143  K 4.0 2.9*  CL 101 101  CO2 29 26  GLUCOSE 321* 155*  BUN 37* 29*  CREATININE 1.22 0.95  CALCIUM 10.8* 10.2  MG  --  1.5   Liver Function Tests:  Recent Labs Lab 05/27/13 1954  AST 27  ALT 20  ALKPHOS 84  BILITOT 0.4  PROT 7.3  ALBUMIN 3.2*    Recent Labs Lab 05/27/13 1954  LIPASE 36   No results found for this basename: AMMONIA,  in the last 168 hours CBC:  Recent Labs Lab 05/27/13 1954 05/28/13 0810 05/28/13  1055  WBC 10.6* 9.9 11.6*  NEUTROABS 8.4*  --   --   HGB 11.8* 12.2* 11.5*  HCT 34.5* 37.0* 33.7*  MCV 83.1 84.5 83.6  PLT 368 349 350   Cardiac Enzymes:  Recent Labs Lab 05/28/13 0810 05/28/13 1310  TROPONINI <0.30 <0.30   BNP (last 3 results) No results found for this basename: PROBNP,  in the last 8760 hours CBG:  Recent Labs Lab 05/28/13 0635 05/28/13 1046 05/28/13 1105 05/28/13 1131 05/28/13 1634  GLUCAP 302* 58* 67* 100* 260*    No results found for this or any previous visit (from the past 240 hour(s)).   Studies: Dg Knee 1-2 Views Left  06/12/2013   CLINICAL DATA:  Pain. Possible gout. No trauma.  EXAM: LEFT KNEE - 1-2 VIEW  COMPARISON:  None.  FINDINGS: Negative for acute fracture, subluxation, joint effusion, or osseous erosion. There is  extensive atherosclerotic calcification. Mild degenerative medial compartment joint narrowing. 7 mm long oval ossification or other mineralization near the fibular head. This may be bursal or tendinous. Location not typical for gout.  IMPRESSION: 1. Negative for acute osseous abnormality. No evidence of erosive arthritis. 2. Nonspecific ossification near the fibular head, likely bursal.   Electronically Signed   By: Tiburcio Pea   On: 2013/06/12 23:10   Dg Knee 1-2 Views Right  2013-06-12   CLINICAL DATA:  Pain. Possible gout. No trauma.  EXAM: RIGHT KNEE - 1-2 VIEW  COMPARISON:  None.  FINDINGS: Negative for acute fracture, subluxation, erosion, or other acute abnormality. Doubt significant knee joint effusion. Tricompartmental osteoarthritis, more advanced than the contralateral knee, with moderate to advanced medial compartment narrowing. Extensive atherosclerotic calcification.  IMPRESSION: 1. Negative for acute osseous abnormality. 2. Tricompartmental osteoarthritis with at least moderate medial compartment narrowing.   Electronically Signed   By: Tiburcio Pea   On: 2013-06-12 23:11   Dg Foot Complete Right  05/28/2013   CLINICAL DATA:  Foot pain and diarrhea.  EXAM: RIGHT FOOT COMPLETE - 3+ VIEW  COMPARISON:  None.  FINDINGS: Negative for fracture subluxation. No osseous erosion. Osteopenia. 1st MCP joint osteoarthritis with mild joint narrowing. Talonavicular joint degenerative change with anterior process talus large osteophyte.  IMPRESSION: Negative for acute osseous abnormality.   Electronically Signed   By: Tiburcio Pea   On: 05/28/2013 00:01    Scheduled Meds: . aspirin EC  81 mg Oral Daily  . atorvastatin  10 mg Oral Daily  . carvedilol  25 mg Oral BID WC  . cefTRIAXone (ROCEPHIN)  IV  1 g Intravenous Q24H  . clopidogrel  75 mg Oral QAC breakfast  . diltiazem  60 mg Oral Q12H  . donepezil  10 mg Oral QHS  . feeding supplement  1 Container Oral Q24H  . [START ON 05/29/2013]  feeding supplement  237 mL Oral BID BM  . irbesartan  150 mg Oral Daily   And  . hydrochlorothiazide  12.5 mg Oral Daily  . [START ON 05/29/2013] influenza vac split quadrivalent PF  0.5 mL Intramuscular Tomorrow-1000  . insulin aspart  0-15 Units Subcutaneous TID WC  . potassium chloride  40 mEq Oral BID  . sodium chloride  3 mL Intravenous Q12H   Continuous Infusions: . heparin 1,000 Units/hr (05/28/13 0148)    Principal Problem:   Paroxysmal a-fib Active Problems:   Gout attack   Dementia   UTI (urinary tract infection)    Time spent: 30 min    Moneisha Vosler  [facility] Pager [phone]. If 7PM-7AM, please contact night-coverage at [email or web address], password [facility] 05/28/2013, 5:15 PM  LOS: 1 day

## 2013-05-28 NOTE — Progress Notes (Signed)
ANTICOAGULATION CONSULT NOTE - Initial Consult  Pharmacy Consult for heparin Indication: atrial fibrillation  No Known Allergies  Patient Measurements: Height: 5\' 8"  (172.7 cm) Weight: 160 lb (72.576 kg) IBW/kg (Calculated) : 68.4  Vital Signs: BP: 145/77 mmHg (09/15 2315) Pulse Rate: 80 (09/15 2315)  Labs:  Recent Labs  05/27/13 1954  HGB 11.8*  HCT 34.5*  PLT 368  CREATININE 1.22    Estimated Creatinine Clearance: 49.1 ml/min (by C-G formula based on Cr of 1.22).   Medical History: Past Medical History  Diagnosis Date  . Gout   . Stroke   . Dementia   . Myocardial infarct     Assessment: 79yo male presents w/ weakness, LE pain, and decreased appetite x10d, found to be in new-onset Afib, to begin heparin.  Goal of Therapy:  Heparin level 0.3-0.7 units/ml Monitor platelets by anticoagulation protocol: Yes   Plan:  Will give heparin 4000 units IV bolus x1 followed by gtt at 1000 units/hr and monitor heparin levels and CBC.  Vernard Gambles, PharmD, BCPS  05/28/2013,1:00 AM

## 2013-05-28 NOTE — Progress Notes (Signed)
ANTICOAGULATION CONSULT NOTE - Follow Up  Pharmacy Consult for heparin Indication: atrial fibrillation  No Known Allergies  Patient Measurements: Height: 5\' 8"  (172.7 cm) Weight: 142 lb 10.2 oz (64.7 kg) IBW/kg (Calculated) : 68.4  Vital Signs: Temp: 98.4 F (36.9 C) (09/16 0633) Temp src: Oral (09/16 0633) BP: 149/72 mmHg (09/16 0744) Pulse Rate: 70 (09/16 0744)  Labs:  Recent Labs  05/27/13 1954 05/28/13 0810 05/28/13 1055  HGB 11.8* 12.2* 11.5*  HCT 34.5* 37.0* 33.7*  PLT 368 349 350  HEPARINUNFRC  --   --  0.57  CREATININE 1.22 0.95  --   TROPONINI  --  <0.30  --    Estimated Creatinine Clearance: 59.6 ml/min (by C-G formula based on Cr of 0.95).  Medical History: Past Medical History  Diagnosis Date  . Gout   . Stroke   . Dementia   . Myocardial infarct    Assessment: 79yo male presents w/ weakness, LE pain, and decreased appetite x10d, found to be in new-onset Afib, to begin heparin.  He has converted back to NSR but has had several episodes of HR to 150-160's.  Heparin level is within therapeutic range at 0.57 on IV heparin rate of 1000 units/hr.  His CBC is stable and he has had no noted bleeding complications.  Goal of Therapy:  Heparin level 0.3-0.7 units/ml Monitor platelets by anticoagulation protocol: Yes   Plan:  1.  Continue IV heparin gtt at 1000 units/hr  2.  Daily heparin levels and CBC 3.  Monitor closely for bleeding complications  Nadara Mustard, PharmD., MS Clinical Pharmacist Pager:  [phone] Thank you for allowing pharmacy to be part of this patients care team. 05/28/2013,1:17 PM

## 2013-05-28 NOTE — ED Notes (Signed)
Pt was able to eat and drink without any issues

## 2013-05-28 NOTE — Progress Notes (Signed)
Pt's HR ^150s non sustained via tele. Asymptomatic. MD on the unit at the time.  Pt now converted from Afib to SR, HR in the 80s. Will cont to monitor closely. Darren Underwood

## 2013-05-28 NOTE — Evaluation (Signed)
Physical Therapy Evaluation Patient Details Name: Darren Underwood MRN: [id number] DOB: Feb 10, 1933 Today's Date: 05/28/2013 Time: 1004-1030 PT Time Calculation (min): 26 min  PT Assessment / Plan / Recommendation History of Present Illness  pt presents with weakness, confusion, and found to have a-fib and UTI.    Clinical Impression  Pt admitted with UTI and weakness. Pt currently with functional limitations due to the deficits listed below (see PT Problem List).  Pt will benefit from skilled PT to increase their independence and safety with mobility to allow discharge to the venue listed below.       PT Assessment  Patient needs continued PT services    Follow Up Recommendations  SNF    Does the patient have the potential to tolerate intense rehabilitation      Barriers to Discharge        Equipment Recommendations  None recommended by PT    Recommendations for Other Services     Frequency Min 2X/week    Precautions / Restrictions Precautions Precautions: Fall Restrictions Weight Bearing Restrictions: No   Pertinent Vitals/Pain Denied pain.        Mobility  Bed Mobility Bed Mobility: Supine to Sit;Sitting - Scoot to [place] of Bed;Sit to Supine Supine to Sit: 3: Mod assist Sitting - Scoot to Edge of Bed: 1: +1 Total assist Sit to Supine: 1: +2 Total assist Sit to Supine: Patient Percentage: 60% Details for Bed Mobility Assistance: cues for sequencing and encouragement.   Transfers Transfers: Sit to Stand;Stand to Sit;Stand Pivot Transfers Sit to Stand: 3: Mod assist;With upper extremity assist;From bed;From chair/3-in-1 Stand to Sit: 3: Mod assist;With upper extremity assist;To chair/3-in-1;To bed Stand Pivot Transfers: 3: Mod assist Details for Transfer Assistance: cues for UE use, sequencing through SPT, encouragement.   Ambulation/Gait Ambulation/Gait Assistance: Not tested (comment) Stairs: No Wheelchair Mobility Wheelchair Mobility: No Modified Rankin  (Stroke Patients Only) Pre-Morbid Rankin Score: Moderate disability Modified Rankin: Severe disability    Exercises     PT Diagnosis: Generalized weakness  PT Problem List: Decreased strength;Decreased activity tolerance;Decreased balance;Decreased mobility;Decreased cognition;Decreased knowledge of use of DME PT Treatment Interventions: DME instruction;Gait training;Functional mobility training;Therapeutic activities;Therapeutic exercise;Balance training;Patient/family education     PT Goals(Current goals can be found in the care plan section) Acute Rehab PT Goals Patient Stated Goal: None stated PT Goal Formulation: With patient Time For Goal Achievement: 06/11/13 Potential to Achieve Goals: Fair  Visit Information  Last PT Received On: 05/28/13 Assistance Needed: +2 History of Present Illness: pt presents with weakness, confusion, and found to have a-fib and UTI.         Prior Functioning  Home Living Family/patient expects to be discharged to:: Skilled nursing facility Additional Comments: pt had lived at home with wife, however unclear level of A wife can provide and if family will be able to care for pt at home.   Prior Function Comments: Unclear as pt lethargic and not answering all questions.   Communication Communication: No difficulties    Cognition  Cognition Arousal/Alertness: Lethargic Behavior During Therapy: Flat affect Overall Cognitive Status: Difficult to assess Difficult to assess due to: Level of arousal    Extremity/Trunk Assessment Upper Extremity Assessment Upper Extremity Assessment: Generalized weakness Lower Extremity Assessment Lower Extremity Assessment: Generalized weakness   Balance Balance Balance Assessed: Yes Static Sitting Balance Static Sitting - Balance Support: Bilateral upper extremity supported;Feet supported Static Sitting - Level of Assistance: 4: Min assist Static Sitting - Comment/# of Minutes: pt tends to lean posteriorly  and requires cueing to attend to balance.    End of Session PT - End of Session Equipment Utilized During Treatment: Gait belt Activity Tolerance: Patient limited by lethargy Patient left: in bed;with call bell/phone within reach Nurse Communication: Mobility status  GP     Sunny Schlein, [place] [phone] 05/28/2013, 11:13 AM

## 2013-05-28 NOTE — Progress Notes (Signed)
INITIAL NUTRITION ASSESSMENT  DOCUMENTATION CODES Per approved criteria  -Not Applicable   INTERVENTION: Provide Glucerna Shakes BID Provide Ensure Pudding once daily  NUTRITION DIAGNOSIS: Predicted suboptimal energy intake related to decreased appetite as evidenced by family's report of poor appetite for 10 days per chart.   Goal: Pt to meet >/= 90% of their estimated nutrition needs   Monitor:  PO intake Weight Labs  Reason for Assessment: Malnutrition Screening Tool, score of 3  79 y.o. male  Admitting Dx: Paroxysmal a-fib  ASSESSMENT: 79 y.o. male who presents for evaluation of right knee and foot pain, diarrhea, inability to walk, and decreased appetite for 10 days. Pt has history of gout, stroke, dementia, and myocardial infarct.  Attempted to visit pt on two occasions but, pt asleep and unresponsive both times. Pt had only consumed about 25% of lunch tray at bedside.  Per nursing notes pt ate 0% of his breakfast.   Height: Ht Readings from Last 1 Encounters:  05/28/13 5\' 8"  (1.727 m)    Weight: Wt Readings from Last 1 Encounters:  05/28/13 142 lb 10.2 oz (64.7 kg)    Ideal Body Weight: 154 lbs  % Ideal Body Weight: 92%  Wt Readings from Last 10 Encounters:  05/28/13 142 lb 10.2 oz (64.7 kg)    Usual Body Weight: unknown  % Usual Body Weight: NA  BMI:  Body mass index is 21.69 kg/(m^2).  Estimated Nutritional Needs: Kcal: 1600-1800 Protein: 70-80 grams Fluid: 1.9 L/day  Skin: WDL  Diet Order: Carb Control  EDUCATION NEEDS: -No education needs identified at this time   Intake/Output Summary (Last 24 hours) at 05/28/13 1645 Last data filed at 05/28/13 1252  Gross per 24 hour  Intake    118 ml  Output      0 ml  Net    118 ml    Last BM: PTA   Labs:   Recent Labs Lab 05/27/13 1954 05/28/13 0810  NA 141 143  K 4.0 2.9*  CL 101 101  CO2 29 26  BUN 37* 29*  CREATININE 1.22 0.95  CALCIUM 10.8* 10.2  MG  --  1.5  GLUCOSE 321*  155*    CBG (last 3)   Recent Labs  05/28/13 1105 05/28/13 1131 05/28/13 1634  GLUCAP 67* 100* 260*    Scheduled Meds: . aspirin EC  81 mg Oral Daily  . atorvastatin  10 mg Oral Daily  . carvedilol  25 mg Oral BID WC  . cefTRIAXone (ROCEPHIN)  IV  1 g Intravenous Q24H  . clopidogrel  75 mg Oral QAC breakfast  . diltiazem  60 mg Oral Q12H  . donepezil  10 mg Oral QHS  . irbesartan  150 mg Oral Daily   And  . hydrochlorothiazide  12.5 mg Oral Daily  . [START ON 05/29/2013] influenza vac split quadrivalent PF  0.5 mL Intramuscular Tomorrow-1000  . insulin aspart  0-15 Units Subcutaneous TID WC  . potassium chloride  40 mEq Oral BID  . sodium chloride  3 mL Intravenous Q12H    Continuous Infusions: . heparin 1,000 Units/hr (05/28/13 0148)    Past Medical History  Diagnosis Date  . Gout   . Stroke   . Dementia   . Myocardial infarct     Past Surgical History  Procedure Laterality Date  . Back surgery      Ian Malkin RD, LDN Inpatient Clinical Dietitian Pager: [phone] After Hours Pager: [phone]

## 2013-05-28 NOTE — Progress Notes (Signed)
Pt's HR ^ 160s via tele. Pt is asymptomatic. VSS. Now ST in 120s. MD made aware. New orders given . Will cont to monitor closely. Benay Pike, RN

## 2013-05-28 NOTE — ED Notes (Signed)
Patient with hx of stroke. Per family at bedside patient with slurred speech, difficulty finding words, and abnormal gait. Pt with hx of dementia. Pt is able to follow commands but looks to wife for most answers to questions.

## 2013-05-29 LAB — HEPARIN LEVEL (UNFRACTIONATED): Heparin Unfractionated: 0.5 IU/mL (ref 0.30–0.70)

## 2013-05-29 LAB — GLUCOSE, CAPILLARY: Glucose-Capillary: 58 mg/dL — ABNORMAL LOW (ref 70–99)

## 2013-05-29 LAB — CBC
HCT: 34.4 % — ABNORMAL LOW (ref 39.0–52.0)
MCH: 28 pg (ref 26.0–34.0)
MCHC: 33.7 g/dL (ref 30.0–36.0)
RDW: 15.5 % (ref 11.5–15.5)

## 2013-05-29 LAB — URINE CULTURE: Colony Count: >=100000

## 2013-05-29 MED ORDER — ENOXAPARIN SODIUM 40 MG/0.4ML ~~LOC~~ SOLN
40.0000 mg | SUBCUTANEOUS | Status: DC
  Filled 2013-05-29 (×2): qty 0.4

## 2013-05-29 MED ORDER — ENOXAPARIN SODIUM 40 MG/0.4ML ~~LOC~~ SOLN
40.0000 mg | SUBCUTANEOUS | Status: DC
  Administered 2013-05-30 – 2013-05-31 (×2): 40 mg via SUBCUTANEOUS
  Filled 2013-05-29 (×3): qty 0.4

## 2013-05-29 MED ORDER — GLIMEPIRIDE 4 MG PO TABS
4.0000 mg | ORAL_TABLET | Freq: Every day | ORAL | Status: DC
  Administered 2013-05-29: 09:00:00 4 mg via ORAL
  Filled 2013-05-29 (×3): qty 1

## 2013-05-29 MED ORDER — SODIUM CHLORIDE 0.9 % IV SOLN
INTRAVENOUS | Status: DC
  Administered 2013-05-29: 10:00:00 via INTRAVENOUS

## 2013-05-29 MED ORDER — DILTIAZEM HCL ER COATED BEADS 180 MG PO CP24
180.0000 mg | ORAL_CAPSULE | Freq: Every day | ORAL | Status: DC
  Administered 2013-05-29 – 2013-05-31 (×3): 180 mg via ORAL
  Filled 2013-05-29 (×4): qty 1

## 2013-05-29 MED ORDER — METFORMIN HCL 500 MG PO TABS
1000.0000 mg | ORAL_TABLET | Freq: Two times a day (BID) | ORAL | Status: DC
  Administered 2013-05-29 – 2013-05-31 (×5): 1000 mg via ORAL
  Filled 2013-05-29 (×7): qty 2

## 2013-05-29 MED ORDER — INSULIN ASPART 100 UNIT/ML ~~LOC~~ SOLN
4.0000 [IU] | Freq: Three times a day (TID) | SUBCUTANEOUS | Status: DC
  Administered 2013-05-29 – 2013-05-30 (×2): 4 [IU] via SUBCUTANEOUS

## 2013-05-29 MED ORDER — INSULIN ASPART 100 UNIT/ML ~~LOC~~ SOLN
0.0000 [IU] | Freq: Three times a day (TID) | SUBCUTANEOUS | Status: DC
  Administered 2013-05-29: 11 [IU] via SUBCUTANEOUS
  Administered 2013-05-30: 3 [IU] via SUBCUTANEOUS
  Administered 2013-05-30 – 2013-05-31 (×3): 5 [IU] via SUBCUTANEOUS
  Administered 2013-05-31: 3 [IU] via SUBCUTANEOUS

## 2013-05-29 NOTE — Progress Notes (Signed)
[facility]  Patient Status: Active (receiving services up to time of hospitalization)  [facility] is providing the following services: RN, PT and HHA  If patient discharges after hours, please call [phone].   Darren Underwood 05/29/2013, 4:06 PM

## 2013-05-29 NOTE — Progress Notes (Signed)
Subjective: Feels better this am  Objective: Vital signs in last 24 hours: Temp:  [97.9 F (36.6 C)-98.5 F (36.9 C)] 98.1 F (36.7 C) (09/17 0503) Pulse Rate:  [63-82] 69 (09/17 0503) Resp:  [17-18] 17 (09/17 0503) BP: (142-163)/(71-72) 162/71 mmHg (09/17 0503) SpO2:  [97 %-98 %] 97 % (09/17 0503) Weight:  [64.3 kg (141 lb 12.1 oz)] 64.3 kg (141 lb 12.1 oz) (09/17 0458) Weight change: -8.276 kg (-18 lb 3.9 oz)    Intake/Output from previous day: 09/16 0701 - 09/17 0700 In: 238 [P.O.:238] Out: 50 [Urine:50] Intake/Output this shift:    General appearance: alert and cooperative Resp: clear to auscultation bilaterally Cardio: regular rate and rhythm, S1, S2 normal, no murmur, click, rub or gallop Extremities: no edema, no joints swelling or pain  Lab Results:  Recent Labs  05/28/13 0810 05/28/13 1055  WBC 9.9 11.6*  HGB 12.2* 11.5*  HCT 37.0* 33.7*  PLT 349 350   BMET  Recent Labs  05/27/13 1954 05/28/13 0810  NA 141 143  K 4.0 2.9*  CL 101 101  CO2 29 26  GLUCOSE 321* 155*  BUN 37* 29*  CREATININE 1.22 0.95  CALCIUM 10.8* 10.2    Studies/Results: Dg Knee 1-2 Views Left  05/27/2013   CLINICAL DATA:  Pain. Possible gout. No trauma.  EXAM: LEFT KNEE - 1-2 VIEW  COMPARISON:  None.  FINDINGS: Negative for acute fracture, subluxation, joint effusion, or osseous erosion. There is extensive atherosclerotic calcification. Mild degenerative medial compartment joint narrowing. 7 mm long oval ossification or other mineralization near the fibular head. This may be bursal or tendinous. Location not typical for gout.  IMPRESSION: 1. Negative for acute osseous abnormality. No evidence of erosive arthritis. 2. Nonspecific ossification near the fibular head, likely bursal.   Electronically Signed   By: Tiburcio Pea   On: 05/27/2013 23:10   Dg Knee 1-2 Views Right  05/27/2013   CLINICAL DATA:  Pain. Possible gout. No trauma.  EXAM: RIGHT KNEE - 1-2 VIEW  COMPARISON:  None.   FINDINGS: Negative for acute fracture, subluxation, erosion, or other acute abnormality. Doubt significant knee joint effusion. Tricompartmental osteoarthritis, more advanced than the contralateral knee, with moderate to advanced medial compartment narrowing. Extensive atherosclerotic calcification.  IMPRESSION: 1. Negative for acute osseous abnormality. 2. Tricompartmental osteoarthritis with at least moderate medial compartment narrowing.   Electronically Signed   By: Tiburcio Pea   On: 05/27/2013 23:11   Dg Foot Complete Right  05/28/2013   CLINICAL DATA:  Foot pain and diarrhea.  EXAM: RIGHT FOOT COMPLETE - 3+ VIEW  COMPARISON:  None.  FINDINGS: Negative for fracture subluxation. No osseous erosion. Osteopenia. 1st MCP joint osteoarthritis with mild joint narrowing. Talonavicular joint degenerative change with anterior process talus large osteophyte.  IMPRESSION: Negative for acute osseous abnormality.   Electronically Signed   By: Tiburcio Pea   On: 05/28/2013 00:01    Medications: I have reviewed the patient's current medications.  Assessment/Plan: Principal Problem:   Atrial fibrillation with RVR, back in NSR.  Echo pending.  D/C heparin.  Continue ASA.  Continue diltiazem Active Problems:   Gout attack recent, no evidence of acute gout now.   Dementia   UTI (urinary tract infection) on rocephin, awaiting culture result   Hypokalemia replace   Hypomagnesemia replaced   Essential hypertension, benign started on all oupt meds, diltiazem started norvasc stopped   Type II or unspecified type diabetes mellitus without mention of complication, poor control, restart  outpt meds and SSI except hold actos   Disposition may need ST-[facility]   LOS: 2 days   Darren Underwood JOSEPH 05/29/2013, 7:41 AM

## 2013-05-30 LAB — GLUCOSE, CAPILLARY
Glucose-Capillary: 153 mg/dL — ABNORMAL HIGH (ref 70–99)
Glucose-Capillary: 159 mg/dL — ABNORMAL HIGH (ref 70–99)

## 2013-05-30 LAB — BASIC METABOLIC PANEL
BUN: 20 mg/dL (ref 6–23)
CO2: 25 mEq/L (ref 19–32)
Chloride: 98 mEq/L (ref 96–112)
Glucose, Bld: 244 mg/dL — ABNORMAL HIGH (ref 70–99)
Potassium: 3.5 mEq/L (ref 3.5–5.1)
Sodium: 134 mEq/L — ABNORMAL LOW (ref 135–145)

## 2013-05-30 LAB — CBC
HCT: 30.8 % — ABNORMAL LOW (ref 39.0–52.0)
Hemoglobin: 10.5 g/dL — ABNORMAL LOW (ref 13.0–17.0)
MCHC: 34.1 g/dL (ref 30.0–36.0)
RBC: 3.73 MIL/uL — ABNORMAL LOW (ref 4.22–5.81)
WBC: 8.7 10*3/uL (ref 4.0–10.5)

## 2013-05-30 MED ORDER — POTASSIUM CHLORIDE CRYS ER 20 MEQ PO TBCR
20.0000 meq | EXTENDED_RELEASE_TABLET | Freq: Two times a day (BID) | ORAL | Status: DC
  Administered 2013-05-30 – 2013-05-31 (×3): 20 meq via ORAL
  Filled 2013-05-30 (×4): qty 1

## 2013-05-30 MED ORDER — SULFAMETHOXAZOLE-TMP DS 800-160 MG PO TABS
1.0000 | ORAL_TABLET | Freq: Two times a day (BID) | ORAL | Status: DC
  Administered 2013-05-30: 21:00:00 1 via ORAL
  Filled 2013-05-30 (×5): qty 1

## 2013-05-30 MED ORDER — GLIMEPIRIDE 2 MG PO TABS
2.0000 mg | ORAL_TABLET | Freq: Every day | ORAL | Status: DC
  Administered 2013-05-31: 08:00:00 2 mg via ORAL
  Filled 2013-05-30 (×2): qty 1

## 2013-05-30 NOTE — Clinical Social Work Placement (Addendum)
    Clinical Social Work Department CLINICAL SOCIAL WORK PLACEMENT NOTE 06/10/2013  Patient:  Darren Underwood, Darren Underwood  Account Number:  [id number] Admit date:  05/27/2013  Clinical Social Worker:  Lupita Leash Ellyse Rotolo, LCSWA  Date/time:  05/30/2013 05:07 PM  Clinical Social Work is seeking post-discharge placement for this patient at the following level of care:   SKILLED NURSING   (*CSW will update this form in Epic as items are completed)   05/30/2013  Patient/family provided with [facility] Department of Clinical Social Work's list of facilities offering this level of care within the geographic area requested by the patient (or if unable, by the patient's family).  05/30/2013  Patient/family informed of their freedom to choose among providers that offer the needed level of care, that participate in Medicare, Medicaid or managed care program needed by the patient, have an available bed and are willing to accept the patient.  05/30/2013  Patient/family informed of [facility]' ownership interest in [facility], as well as of the fact that they are under no obligation to receive care at this facility.  PASARR submitted to EDS on 05/30/2013 PASARR number received from EDS on 05/30/2013  FL2 transmitted to all facilities in geographic area requested by pt/family on  05/30/2013 FL2 transmitted to all facilities within larger geographic area on   Patient informed that his/her managed care company has contracts with or will negotiate with  certain facilities, including the following:   * Sent to [facility] only per request of patient and wife     Patient/family informed of bed offers received:  05/30/2013 Patient chooses bed at [facility] Physician recommends and patient chooses bed at    Patient to be transferred to [facility] on  05/31/2013 Patient to be transferred to facility by [facility])  The following physician request were entered in  Epic:   Additional Comments: DC back to [facility] 05/31/13

## 2013-05-30 NOTE — Progress Notes (Signed)
Subjective: No complaints  Objective: Vital signs in last 24 hours: Temp:  [97.2 F (36.2 C)-98.4 F (36.9 C)] 98.4 F (36.9 C) (09/18 0422) Pulse Rate:  [63-85] 75 (09/18 0617) Resp:  [18] 18 (09/18 0422) BP: (122-146)/(53-66) 146/65 mmHg (09/18 0422) SpO2:  [98 %-100 %] 100 % (09/18 0422) Weight:  [65.454 kg (144 lb 4.8 oz)] 65.454 kg (144 lb 4.8 oz) (09/18 0422) Weight change: 1.154 kg (2 lb 8.7 oz)    Intake/Output from previous day: 09/17 0701 - 09/18 0700 In: 360 [P.O.:360] Out: 775 [Urine:775] Intake/Output this shift:    General appearance: alert and cooperative Resp: clear to auscultation bilaterally Cardio: regular rate and rhythm, S1, S2 normal, no murmur, click, rub or gallop GI: soft, non-tender; bowel sounds normal; no masses,  no organomegaly Extremities: extremities normal, atraumatic, no cyanosis or edema  Lab Results:  Recent Labs  05/29/13 0715 05/30/13 0550  WBC 9.1 8.7  HGB 11.6* 10.5*  HCT 34.4* 30.8*  PLT 276 270   BMET  Recent Labs  05/27/13 1954 05/28/13 0810  NA 141 143  K 4.0 2.9*  CL 101 101  CO2 29 26  GLUCOSE 321* 155*  BUN 37* 29*  CREATININE 1.22 0.95  CALCIUM 10.8* 10.2    Studies/Results: No results found.  Medications: I have reviewed the patient's current medications.  Assessment/Plan: Principal Problem:  Atrial fibrillation with RVR, back in NSR. Echo grade 1 diastolic dysfunction, o/w normal.  Continue ASA. Continue diltiazem  Active Problems:  Gout attack recent, no evidence of acute gout now.  Dementia  UTI (urinary tract infection)klebsiella, change to po septra Hypokalemia replacing Hypomagnesemia replaced  Essential hypertension, controlled Type II or unspecified type diabetes mellitus without mention of complication,better control, restarted outpt meds and SSI except hold actos.  Hypoglycemia yesterday, reduce amaryl Disposition spoke with wife, she prefers [facility] as recommended by PT, SW consult.  He  will be ready for discharge tomorrow.    LOS: 3 days   Nakeshia Waldeck JOSEPH 05/30/2013, 7:22 AM

## 2013-05-30 NOTE — Clinical Social Work Psychosocial (Signed)
     Clinical Social Work Department BRIEF PSYCHOSOCIAL ASSESSMENT 05/30/2013  Patient:  Darren Underwood, Darren Underwood     Account Number:  [id number]     Admit date:  05/27/2013  Clinical Social Worker:  Tiburcio Pea  Date/Time:  05/30/2013 05:00 PM  Referred by:  Physician  Date Referred:  05/29/2013 Referred for  SNF Placement   Other Referral:   Interview type:  Other - See comment Other interview type:   Patient and wife    PSYCHOSOCIAL DATA Living Status:  WIFE Admitted from facility:   Level of care:   Primary support name:  Darren Underwood  (c) [phone] Primary support relationship to patient:  SPOUSE Degree of support available:   Strong support    CURRENT CONCERNS Current Concerns  Post-Acute Placement   Other Concerns:    SOCIAL WORK ASSESSMENT / PLAN CSW met with patient and his wife Darren Underwood this afternoon to discuss Physical Therapy's recommendation for short term SNF. Patient and his wife live together and were to have home health services but patient was admitted to the hospital before the service could be started. They both agree to short term SNF and CSW discussed SNF bed search process. They request placement at [facility] as their daughter is currently a resident there. Wife has been in contact with facility earlier this week.  Fl2 completed and sent to [facility]; bed offer recieved from the facility. Fl2 placed on chart for MD's signature.  Awaiting stability per MD.   Assessment/plan status:  Psychosocial Support/Ongoing Assessment of Needs Other assessment/ plan:   Information/referral to community resources:   SNF given to patient/wife but they have chosen [facility] and accepted a bed there.    PATIENTS/FAMILYS RESPONSE TO PLAN OF CARE: Patient is alert and oriented; very pleasant gentleman. He agrees to short term SNF as does his wife.  Plans to return home with [facility] once rehab has been completed. CSW will need to follow up with MD regarding  tentative d/c date.  Darren Underwood. Darren Underwood, LCSWA  [phone]

## 2013-05-30 NOTE — Progress Notes (Signed)
Pt has not had any c/o pain, pt oob with PT to chair, tolerated well, back to bed with walker, pt stable, will continue to monitor

## 2013-05-30 NOTE — Progress Notes (Signed)
Physical Therapy Treatment Patient Details Name: GWEN EDLER MRN: [id number] DOB: [birth year] Today's Date: 05/30/2013 Time: 4098-1191 PT Time Calculation (min): 23 min  PT Assessment / Plan / Recommendation  History of Present Illness pt presents with weakness, confusion, and found to have a-fib and UTI.     PT Comments   Pt progressing with mobility today.  Ambulated ~25' with RW however cont's to be limited by weakness & confusion.     Follow Up Recommendations  SNF     Does the patient have the potential to tolerate intense rehabilitation     Barriers to Discharge        Equipment Recommendations  None recommended by PT    Recommendations for Other Services    Frequency Min 2X/week   Progress towards PT Goals Progress towards PT goals: Progressing toward goals  Plan Current plan remains appropriate    Precautions / Restrictions Precautions Precautions: Fall Restrictions Weight Bearing Restrictions: No   Pertinent Vitals/Pain C/o Rt foot pain.  Did not rate.      Mobility  Bed Mobility Bed Mobility: Supine to Sit;Sitting - Scoot to Edge of Bed Supine to Sit: With rails;HOB elevated;4: Min guard Sitting - Scoot to [place] of Bed: 4: Min assist Details for Bed Mobility Assistance: cues for technique.  (A) with use of draw pad to bring hips closer to EOB.  Incr time  Transfers Transfers: Sit to Stand;Stand to Sit Sit to Stand: 3: Mod assist;With upper extremity assist;From bed;With armrests Stand to Sit: 3: Mod assist;With upper extremity assist;With armrests;To chair/3-in-1 Details for Transfer Assistance: cues for hand placement & technique.   Ambulation/Gait Ambulation/Gait Assistance: 4: Min assist (+2 to follow with chair) Ambulation Distance (Feet): 25 Feet Assistive device: Rolling walker Ambulation/Gait Assistance Details: cues for safe use of RW.  (A) for balance.   Gait Pattern: Step-through pattern;Decreased stride length;Trunk flexed;Shuffle Gait  velocity: decreased Stairs: No Wheelchair Mobility Wheelchair Mobility: No Modified Rankin (Stroke Patients Only) Pre-Morbid Rankin Score: Moderate disability Modified Rankin: Moderately severe disability      PT Goals (current goals can now be found in the care plan section) Acute Rehab PT Goals PT Goal Formulation: With patient Time For Goal Achievement: 06/11/13 Potential to Achieve Goals: Fair  Visit Information  Last PT Received On: 05/30/13 Assistance Needed: +2 History of Present Illness: pt presents with weakness, confusion, and found to have a-fib and UTI.      Subjective Data      Cognition  Cognition Arousal/Alertness: Lethargic Behavior During Therapy: Flat affect Overall Cognitive Status: Difficult to assess Difficult to assess due to: Level of arousal    Balance     End of Session PT - End of Session Equipment Utilized During Treatment: Gait belt Activity Tolerance: Patient tolerated treatment well Patient left: in chair;with call bell/phone within reach (NT present) Nurse Communication: Mobility status   GP     Lara Mulch 05/30/2013, 2:26 PM  Verdell Face, PTA [phone] 05/30/2013

## 2013-05-30 NOTE — Progress Notes (Signed)
Inpatient Diabetes Program Recommendations  AACE/ADA: New Consensus Statement on Inpatient Glycemic Control (2013)  Target Ranges:  Prepandial:   less than 140 mg/dL      Peak postprandial:   less than 180 mg/dL (1-2 hours)      Critically ill patients:  140 - 180 mg/dL   Reason for Visit:Results for Darren Underwood, Darren Underwood (MRN [id number]) as of 05/30/2013 13:38  Ref. Range 05/29/2013 16:31 05/29/2013 19:33 05/29/2013 21:03 05/30/2013 05:58 05/30/2013 11:35  Glucose-Capillary Latest Range: 70-99 mg/dL 58 (L) 621 (H) 308 (H) 248 (H) 208 (H)   CBG's up and down.  CBG dropped yesterday afternoon after patient received 11 units of Novolog correction and 4 units of Novolog meal coverage.  May consider adding low dose basal insulin Lantus 10 units daily to prevent spikes in CBG's.   Thanks, Beryl Meager, RN, BC-ADM Inpatient Diabetes Coordinator Pager [phone]

## 2013-05-31 LAB — CBC
Hemoglobin: 10.7 g/dL — ABNORMAL LOW (ref 13.0–17.0)
Platelets: 261 10*3/uL (ref 150–400)
RBC: 3.78 MIL/uL — ABNORMAL LOW (ref 4.22–5.81)
WBC: 9.4 10*3/uL (ref 4.0–10.5)

## 2013-05-31 LAB — BASIC METABOLIC PANEL
CO2: 22 mEq/L (ref 19–32)
GFR calc non Af Amer: 79 mL/min — ABNORMAL LOW (ref >90)
Glucose, Bld: 178 mg/dL — ABNORMAL HIGH (ref 70–99)
Potassium: 4.1 mEq/L (ref 3.5–5.1)
Sodium: 137 mEq/L (ref 135–145)

## 2013-05-31 LAB — GLUCOSE, CAPILLARY: Glucose-Capillary: 240 mg/dL — ABNORMAL HIGH (ref 70–99)

## 2013-05-31 MED ORDER — CIPROFLOXACIN HCL 500 MG PO TABS: 500.0000 mg | ORAL_TABLET | Freq: Two times a day (BID) | ORAL | Status: DC

## 2013-05-31 MED ORDER — CARVEDILOL 25 MG PO TABS: 25.0000 mg | ORAL_TABLET | Freq: Two times a day (BID) | ORAL | Status: AC

## 2013-05-31 MED ORDER — CIPROFLOXACIN HCL 500 MG PO TABS
500.0000 mg | ORAL_TABLET | Freq: Two times a day (BID) | ORAL | Status: DC
  Administered 2013-05-31: 500 mg via ORAL
  Filled 2013-05-31 (×3): qty 1

## 2013-05-31 MED ORDER — DILTIAZEM HCL ER COATED BEADS 180 MG PO CP24: 180.0000 mg | ORAL_CAPSULE | Freq: Every day | ORAL | Status: AC

## 2013-05-31 NOTE — Discharge Summary (Signed)
Physician Discharge Summary  Patient ID: Darren Underwood MRN: [id number] DOB/AGE: Jan 11, 1933 79 y.o.  Admit date: 05/27/2013 Discharge date: 05/31/2013  Admission Diagnoses: Atrial fibrillation with rapid ventricular response UTI Dementia Diabetes mellitus type 2 Hypertension Gout Hypokalemia  Discharge Diagnoses:  Principal Problem:   Atrial fibrillation with RVR Active Problems:   Gout attack   Dementia, vascular   UTI (urinary tract infection)   Hypokalemia   Hypomagnesemia   Essential hypertension, benign   Type II or unspecified type diabetes mellitus without mention of complication, not stated as uncontrolled    Coronary artery disease Cerebrovascular disease BPH   Discharged Condition: good  Hospital Course: The patient was admitted on September 16 complaining of right knee and foot pain, diarrhea, inability to walk, decreased appetite. He recently treated with a course of prednisone for gout. In the ER he was found to be in atrial fibrillation with rapid ventricular response and had a UTI. He was admitted and started on Rocephin. His urine culture showed Klebsiella, pansensitive and he was switched to Cipro. He spontaneously converted to normal sinus rhythm. Corey dose was increased and diltiazem started amlodipine stopped to control rate. He was continued on aspirin, felt to be a poor long-term Coumadin candidate. His echocardiogram showed normal systolic function, ejection fraction 60-65%, grade 1 diastolic dysfunction. The patient had no further diarrhea during the hospitalization. He had no evidence of acute gout. His blood sugars were initially under poor control, treated with sliding scale and then resumption of his oral medications with better control. Blood pressure control was acceptable on current regimen. He was seen by physical therapy and felt to be a good candidate for short-term nursing home placement for rehabilitation with eventual return home  Consults:  None  Significant Diagnostic Studies: labs: At discharge WBC 9.4, hemoglobin 10.7, platelets 261, sodium 137, potassium 4.1, chloride 101, bicarbonate 22, BUN 20, creatinine 0.94, microbiology: urine culture: positive for Klebsiella pneumoniae, radiology: Right knee tricompartmental arthritis, right foot negative and cardiac graphics: Echocardiogram: As above  Treatments: IV hydration, antibiotics: Cipro and ceftriaxone, cardiac meds: carvedilol and diltiazem, anticoagulation: ASA and therapies: PT  Discharge Exam: Blood pressure 146/67, pulse 71, temperature 98.3 F (36.8 C), temperature source Oral, resp. rate 16, height 5\' 8"  (1.727 m), weight 65.998 kg (145 lb 8 oz), SpO2 98.00%. General appearance: alert and cooperative Resp: clear to auscultation bilaterally Cardio: regular rate and rhythm, S1, S2 normal, no murmur, click, rub or gallop  Disposition:    Future Appointments Provider Department Dept Phone   06/20/2013 10:15 AM Lenn Sink, DPM [facility] [phone]       Medication List    STOP taking these medications       amLODipine 10 MG tablet  Commonly known as:  NORVASC      TAKE these medications       aspirin EC 81 MG tablet  Take 81 mg by mouth daily.     atorvastatin 20 MG tablet  Commonly known as:  LIPITOR  Take 10 mg by mouth daily.     carvedilol 25 MG tablet  Commonly known as:  COREG  Take 1 tablet (25 mg total) by mouth 2 (two) times daily with a meal.     ciprofloxacin 500 MG tablet  Commonly known as:  CIPRO  Take 1 tablet (500 mg total) by mouth 2 (two) times daily. For 4 days     clopidogrel 75 MG tablet  Commonly known as:  PLAVIX  Take 75 mg by mouth daily.     diltiazem 180 MG 24 hr capsule  Commonly known as:  CARDIZEM CD  Take 1 capsule (180 mg total) by mouth daily.     donepezil 10 MG tablet  Commonly known as:  ARICEPT  Take 10 mg by mouth at bedtime.     glimepiride 4 MG tablet  Commonly known as:   AMARYL  Take 4 mg by mouth daily before breakfast.     metFORMIN 850 MG tablet  Commonly known as:  GLUCOPHAGE  Take 850 mg by mouth 2 (two) times daily with a meal.     olmesartan-hydrochlorothiazide 40-25 MG per tablet  Commonly known as:  BENICAR HCT  Take 0.5 tablets by mouth daily.     pioglitazone 45 MG tablet  Commonly known as:  ACTOS  Take 45 mg by mouth daily.     potassium chloride SA 20 MEQ tablet  Commonly known as:  K-DUR,KLOR-CON  Take 20 mEq by mouth 2 (two) times daily.     traMADol 50 MG tablet  Commonly known as:  ULTRAM  Take 50 mg by mouth every 6 (six) hours as needed. For pain           Follow-up Information   Follow up with Lillia Mountain, MD. (2 weeks after return home from nursing home)    Specialty:  Internal Medicine   Contact information:   [address], [address] Jaynie Crumble [place] [address] [phone]       Signed: Lillia Mountain 05/31/2013, 7:34 AM

## 2013-05-31 NOTE — Plan of Care (Signed)
Problem: Phase I Progression Outcomes Goal: Initial discharge plan identified Outcome: Completed/Met Date Met:  05/31/13 Pt to d/c to SNF

## 2013-05-31 NOTE — Progress Notes (Signed)
CSW (Clinical Child psychotherapist) placed pt [employer] with pt shadow chart. Pt nurse to add any prescriptions once they are received. CSW called for transport at 1pm and informed pt wife, facility, and pt nurse. CSW signing off.  Towanda Hornstein, LCSWA [phone]

## 2013-05-31 NOTE — Progress Notes (Signed)
Pt being d/c to [facility], report called, pt leaving via ambulance, pt stable

## 2013-06-06 ENCOUNTER — Non-Acute Institutional Stay (SKILLED_NURSING_FACILITY): Payer: Medicare Other | Admitting: Internal Medicine

## 2013-06-06 DIAGNOSIS — I4891 Unspecified atrial fibrillation: Secondary | ICD-10-CM

## 2013-06-06 DIAGNOSIS — N390 Urinary tract infection, site not specified: Secondary | ICD-10-CM

## 2013-06-06 DIAGNOSIS — I480 Paroxysmal atrial fibrillation: Secondary | ICD-10-CM

## 2013-06-06 DIAGNOSIS — F0390 Unspecified dementia without behavioral disturbance: Secondary | ICD-10-CM

## 2013-06-06 DIAGNOSIS — I2510 Atherosclerotic heart disease of native coronary artery without angina pectoris: Secondary | ICD-10-CM

## 2013-06-20 ENCOUNTER — Ambulatory Visit: Payer: Self-pay | Admitting: Podiatry

## 2013-06-27 ENCOUNTER — Non-Acute Institutional Stay (SKILLED_NURSING_FACILITY): Payer: Medicare Other | Admitting: Internal Medicine

## 2013-07-09 DIAGNOSIS — I2510 Atherosclerotic heart disease of native coronary artery without angina pectoris: Secondary | ICD-10-CM | POA: Insufficient documentation

## 2013-07-09 NOTE — Progress Notes (Signed)
Patient ID: Darren Underwood, male   DOB: 14-Sep-1932, 79 y.o.   MRN: [id number]        HISTORY & PHYSICAL  DATE: 06/06/2013   FACILITY: [facility]  LEVEL OF CARE: SNF (31)  ALLERGIES:  No Known Allergies  CHIEF COMPLAINT:  Manage atrial fibrillation, UTI, and dementia.    HISTORY OF PRESENT ILLNESS:  The patient is a 79 year-old, African-American male who was hospitalized for diarrhea, inability to walk, right knee and foot pain.  After hospitalization, he is admitted to this facility for possible long-term care management.    ATRIAL FIBRILLATION: the patients atrial fibrillation remains stable.  The patient denies DOE, tachycardia, orthopnea, transient neurological sx, pedal edema, palpitations, & PNDs.  No complications noted from the medications currently being used.    DEMENTIA: The dementia remains stable and continues to function adequately in the current living environment with supervision.  The patient has had little changes in behavior. No complications noted from the medications presently being used.    UTI: The UTI remains stable.  The patient denies ongoing suprapubic pain, flank pain, dysuria, urinary frequency, urinary hesitancy or hematuria.  No complications reported from the current antibiotic being used.  The patient is on Cipro.    PAST MEDICAL HISTORY :  Past Medical History  Diagnosis Date  . Gout   . Stroke   . Dementia   . Myocardial infarct     PAST SURGICAL HISTORY: Past Surgical History  Procedure Laterality Date  . Back surgery      SOCIAL HISTORY:  reports that he quit smoking about 30 years ago. He does not have any smokeless tobacco history on file. He reports that he does not drink alcohol or use illicit drugs.  FAMILY HISTORY: None  CURRENT MEDICATIONS: Reviewed per MAR  REVIEW OF SYSTEMS:  See HPI otherwise 14 point ROS is negative.  PHYSICAL EXAMINATION  VS:  T 98.1       P 74      RR 18      BP 130/60      POX%         WT (Lb) 149.2    GENERAL: no acute distress, normal body habitus EYES: conjunctivae normal, sclerae normal, normal eye lids MOUTH/THROAT: lips without lesions,no lesions in the mouth,tongue is without lesions,uvula elevates in midline NECK: supple, trachea midline, no neck masses, no thyroid tenderness, no thyromegaly LYMPHATICS: no LAN in the neck, no supraclavicular LAN RESPIRATORY: breathing is even & unlabored, BS CTAB CARDIAC: heart rate is irregular irregular, no murmur,no extra heart sounds, no edema GI:  ABDOMEN: abdomen soft, normal BS, no masses, no tenderness  LIVER/SPLEEN: no hepatomegaly, no splenomegaly MUSCULOSKELETAL: HEAD: normal to inspection & palpation BACK: no kyphosis, scoliosis or spinal processes tenderness EXTREMITIES: LEFT UPPER EXTREMITY: full range of motion, normal strength & tone RIGHT UPPER EXTREMITY:  full range of motion, normal strength & tone LEFT LOWER EXTREMITY: strength decreased, range of motion moderate   RIGHT LOWER EXTREMITY: strength decreased, range of motion moderate   PSYCHIATRIC: the patient is alert & oriented to person, affect & behavior appropriate   NEUROLOGICAL:    CRANIAL NERVES: the patient has right-sided droop    LABS/RADIOLOGY: WBC 11.6, hemoglobin 11.5, platelets 350.    Potassium 2.9, glucose 155, BUN 29, otherwise BMP normal.    2D-echo:  Showed EF of 60-65%, grade 1 diastolic dysfunction.    At discharge:  WBC 9.4, hemoglobin 10.7, platelets 261.  Sodium 137, potassium 4.1, chloride 101, bicarbonate 22, BUN 20, creatinine 0.94.    Urine culture grew Klebsiella pneumoniae.    Right knee x-ray:  Showed tricompartmental arthritis.    Right foot x-ray:  Negative for acute findings.     ASSESSMENT/PLAN:  Atrial fibrillation.  Rate controlled.  The patient is a poor Coumadin candidate.    UTI.  Continue Cipro.    Vascular dementia.  Stable.    CAD.  Stable.    Hypertension.  Well controlled.    Diabetes  mellitus.  Continue current medications.    Check CBC and BMP.    I have reviewed patient's medical records received at admission/from hospitalization.  CPT CODE: 19147

## 2013-07-11 ENCOUNTER — Non-Acute Institutional Stay (SKILLED_NURSING_FACILITY): Payer: Medicare Other | Admitting: Internal Medicine

## 2013-07-11 DIAGNOSIS — IMO0001 Reserved for inherently not codable concepts without codable children: Secondary | ICD-10-CM

## 2013-07-11 DIAGNOSIS — E7800 Pure hypercholesterolemia, unspecified: Secondary | ICD-10-CM | POA: Insufficient documentation

## 2013-07-11 DIAGNOSIS — I4891 Unspecified atrial fibrillation: Secondary | ICD-10-CM

## 2013-07-11 DIAGNOSIS — I10 Essential (primary) hypertension: Secondary | ICD-10-CM

## 2013-07-11 DIAGNOSIS — I480 Paroxysmal atrial fibrillation: Secondary | ICD-10-CM

## 2013-07-11 NOTE — Progress Notes (Signed)
PROGRESS NOTE  DATE: 07/11/2013  FACILITY: Nursing Home Location: [facility]  LEVEL OF CARE: SNF (31)  Routine Visit  CHIEF COMPLAINT:  Manage atrial fibrillation, diabetes mellitus and hypertension  HISTORY OF PRESENT ILLNESS:  REASSESSMENT OF ONGOING PROBLEM(S):  ATRIAL FIBRILLATION: the patients atrial fibrillation remains stable.  The patient denies DOE, tachycardia, orthopnea, transient neurological sx, pedal edema, palpitations, & PNDs.  No complications noted from the medications currently being used.  DM:pt's DM remains stable.  Pt denies polyuria, polydipsia, polyphagia, changes in vision or hypoglycemic episodes.  No complications noted from the medication presently being used.  Last hemoglobin A1c is: 7.5 in 10/14.  HTN: Pt 's HTN remains stable.  Denies CP, sob, DOE, pedal edema, headaches, dizziness or visual disturbances.  No complications from the medications currently being used.  Last BP : 110/78  PAST MEDICAL HISTORY : Reviewed.  No changes.  CURRENT MEDICATIONS: Reviewed per [facility]  REVIEW OF SYSTEMS:  GENERAL: no change in appetite, no fatigue, no weight changes, no fever, chills or weakness RESPIRATORY: no cough, SOB, DOE, wheezing, hemoptysis CARDIAC: no chest pain, edema or palpitations GI: no abdominal pain, diarrhea, constipation, heart burn, nausea or vomiting  PHYSICAL EXAMINATION  VS:  T 98.1       P 80     RR 16      BP 110/78     POX %     WT (Lb) 142  GENERAL: no acute distress, normal body habitus EYES: conjunctivae normal, sclerae normal, normal eye lids NECK: supple, trachea midline, no neck masses, no thyroid tenderness, no thyromegaly LYMPHATICS: no LAN in the neck, no supraclavicular LAN RESPIRATORY: breathing is even & unlabored, BS CTAB CARDIAC: RRR, no murmur,no extra heart sounds, no edema GI: abdomen soft, normal BS, no masses, no tenderness, no hepatomegaly, no splenomegaly PSYCHIATRIC: the patient is alert &  oriented to person, affect & behavior appropriate  LABS/RADIOLOGY:  10-14 HDL 38 otherwise fasting lipid panel normal 9-14 hemoglobin 10.4, MCV 85 otherwise CBC normal, BMP normal  ASSESSMENT/PLAN:  Atrial fibrillation-rate controlled Diabetes mellitus-uncontrolled. amaryl was increased. Hypertension-well-controlled Hyperlipidemia-well-controlled Anemia of chronic disease-stable Hypokalemia -- Will repleted Dementia-check TSH and vitamin B12 level  CPT CODE: [address]

## 2013-07-21 NOTE — Progress Notes (Signed)
Patient ID: Darren Underwood, male   DOB: 1932-12-25, 79 y.o.   MRN: [id number]        PROGRESS NOTE  DATE: 06/27/2013  FACILITY:  [facility]  LEVEL OF CARE: SNF (31)  Acute Visit  CHIEF COMPLAINT:  Manage diabetes mellitus.      HISTORY OF PRESENT ILLNESS: I was requested by the staff to assess the patient regarding above problem(s):  DM:pt's DM is unstable.  Pt denies polyuria, polydipsia, polyphagia, changes in vision or hypoglycemic episodes.  No complications noted from the medication presently being used.  Last hemoglobin A1c is:  7.5 on 06/21/2013.    PAST MEDICAL HISTORY : Reviewed.  No changes.  CURRENT MEDICATIONS: Reviewed per [facility]  REVIEW OF SYSTEMS:  GENERAL: no change in appetite, no fatigue, no weight changes, no fever, chills or weakness RESPIRATORY: no cough, SOB, DOE,, wheezing, hemoptysis CARDIAC: no chest pain, edema or palpitations GI: no abdominal pain, diarrhea, constipation, heart burn, nausea or vomiting  PHYSICAL EXAMINATION  GENERAL: no acute distress, normal body habitus NECK: supple, trachea midline, no neck masses, no thyroid tenderness, no thyromegaly RESPIRATORY: breathing is even & unlabored, BS CTAB CARDIAC: RRR, no murmur,no extra heart sounds, no edema GI: abdomen soft, normal BS, no masses, no tenderness, no hepatomegaly, no splenomegaly PSYCHIATRIC: the patient is alert & oriented to person, affect & behavior appropriate  ASSESSMENT/PLAN:  Diabetes mellitus.   Uncontrolled problem.  Increase Amaryl to 6 mg q.d.    CPT CODE: 40981

## 2013-08-06 ENCOUNTER — Encounter: Payer: Self-pay | Admitting: Internal Medicine

## 2013-08-06 ENCOUNTER — Non-Acute Institutional Stay (SKILLED_NURSING_FACILITY): Payer: Medicare Other | Admitting: Internal Medicine

## 2013-08-06 DIAGNOSIS — H5789 Other specified disorders of eye and adnexa: Secondary | ICD-10-CM

## 2013-08-06 DIAGNOSIS — I480 Paroxysmal atrial fibrillation: Secondary | ICD-10-CM

## 2013-08-06 DIAGNOSIS — I10 Essential (primary) hypertension: Secondary | ICD-10-CM

## 2013-08-06 DIAGNOSIS — I4891 Unspecified atrial fibrillation: Secondary | ICD-10-CM

## 2013-08-06 NOTE — Progress Notes (Signed)
PROGRESS NOTE  DATE: 08-06-13  FACILITY: Nursing Home Location: [facility]  LEVEL OF CARE: SNF (31)  Routine Visit  CHIEF COMPLAINT:  Manage red eyes, atrial fibrillation, diabetes mellitus and hypertension  HISTORY OF PRESENT ILLNESS:  REASSESSMENT OF ONGOING PROBLEM(S):  ATRIAL FIBRILLATION: the patients atrial fibrillation remains stable.  The patient denies DOE, tachycardia, orthopnea, transient neurological sx, pedal edema, palpitations, & PNDs.  No complications noted from the medications currently being used.  DM:pt's DM remains stable.  Pt denies polyuria, polydipsia, polyphagia, changes in vision or hypoglycemic episodes.  No complications noted from the medication presently being used.  Last hemoglobin A1c is: 7.5 in 10/14.  HTN: Pt 's HTN remains stable.  Denies CP, sob, DOE, pedal edema, headaches, dizziness or visual disturbances.  No complications from the medications currently being used.  Last BP : 110/78, 110/68  RED EYES: New problem. Staff reports that patient has redness in bilateral eyes for several days. Patient denies pain, visual disturbances or discharge. Staff cannot identify precipitating or alleviating factors. There is no temporal relationship.  PAST MEDICAL HISTORY : Reviewed.  No changes.  CURRENT MEDICATIONS: Reviewed per [facility]  REVIEW OF SYSTEMS:  GENERAL: no change in appetite, no fatigue, no weight changes, no fever, chills or weakness RESPIRATORY: no cough, SOB, DOE, wheezing, hemoptysis CARDIAC: no chest pain, edema or palpitations GI: no abdominal pain, diarrhea, constipation, heart burn, nausea or vomiting  PHYSICAL EXAMINATION  VS:  T 96.7       P 98     RR 18      BP 110/68     POX %     WT (Lb) 147  GENERAL: no acute distress, normal body habitus EYES: conjunctival erythema, sclerae normal, normal eye lids NECK: supple, trachea midline, no neck masses, no thyroid tenderness, no thyromegaly LYMPHATICS: no LAN in  the neck, no supraclavicular LAN RESPIRATORY: breathing is even & unlabored, BS CTAB CARDIAC: RRR, no murmur,no extra heart sounds, no edema GI: abdomen soft, normal BS, no masses, no tenderness, no hepatomegaly, no splenomegaly PSYCHIATRIC: the patient is alert & oriented to person, affect & behavior appropriate  LABS/RADIOLOGY:  10-14 HDL 38 otherwise fasting lipid panel normal 9-14 hemoglobin 10.4, MCV 85 otherwise CBC normal, BMP normal  ASSESSMENT/PLAN:  Bilateral eye redness-new problem. We will hold aspirin for 10 days. Atrial fibrillation-rate controlled Diabetes mellitus-uncontrolled. amaryl was increased. Hypertension-well-controlled Hyperlipidemia-well-controlled Anemia of chronic disease-stable Hypokalemia -- Will repleted Dementia-check TSH and vitamin B12 level Check liver profile  CPT CODE: 19147

## 2013-09-26 ENCOUNTER — Non-Acute Institutional Stay (SKILLED_NURSING_FACILITY): Payer: Medicare Other | Admitting: Internal Medicine

## 2013-09-26 DIAGNOSIS — E7800 Pure hypercholesterolemia, unspecified: Secondary | ICD-10-CM

## 2013-09-26 DIAGNOSIS — E1165 Type 2 diabetes mellitus with hyperglycemia: Secondary | ICD-10-CM

## 2013-09-26 DIAGNOSIS — IMO0001 Reserved for inherently not codable concepts without codable children: Secondary | ICD-10-CM

## 2013-09-26 DIAGNOSIS — I480 Paroxysmal atrial fibrillation: Secondary | ICD-10-CM

## 2013-09-26 DIAGNOSIS — I4891 Unspecified atrial fibrillation: Secondary | ICD-10-CM

## 2013-09-26 DIAGNOSIS — I10 Essential (primary) hypertension: Secondary | ICD-10-CM

## 2013-09-28 NOTE — Progress Notes (Signed)
         PROGRESS NOTE  DATE: 09-26-13  FACILITY: Nursing Home Location: [facility]  LEVEL OF CARE: SNF (31)  Routine Visit  CHIEF COMPLAINT:  Manage atrial fibrillation, diabetes mellitus and hypertension  HISTORY OF PRESENT ILLNESS:  REASSESSMENT OF ONGOING PROBLEM(S):  ATRIAL FIBRILLATION: the patients atrial fibrillation remains stable.  The patient denies DOE, tachycardia, orthopnea, transient neurological sx, pedal edema, palpitations, & PNDs.  No complications noted from the medications currently being used.  DM:pt's DM remains stable.  Pt denies polyuria, polydipsia, polyphagia, changes in vision or hypoglycemic episodes.  No complications noted from the medication presently being used.  Last hemoglobin A1c is: 7.5 in 10/14.  HTN: Pt 's HTN remains stable.  Denies CP, sob, DOE, pedal edema, headaches, dizziness or visual disturbances.  No complications from the medications currently being used.  Last BP : 110/78, 110/68, 154/80  PAST MEDICAL HISTORY : Reviewed.  No changes.  [facility]  REVIEW OF SYSTEMS:  GENERAL: no change in appetite, no fatigue, no weight changes, no fever, chills or weakness RESPIRATORY: no cough, SOB, DOE, wheezing, hemoptysis CARDIAC: no chest pain, edema or palpitations GI: no abdominal pain, diarrhea, constipation, heart burn, nausea or vomiting  PHYSICAL EXAMINATION  VS:  T 97.5       P 62     RR 20      BP 154/80     POX %     WT (Lb) 146  GENERAL: no acute distress, normal body habitus EYES: conjunctival erythema, sclerae normal, normal eye lids NECK: supple, trachea midline, no neck masses, no thyroid tenderness, no thyromegaly LYMPHATICS: no LAN in the neck, no supraclavicular LAN RESPIRATORY: breathing is even & unlabored, BS CTAB CARDIAC: RRR, no murmur,no extra heart sounds, no edema GI: abdomen soft, normal BS, no masses, no tenderness, no hepatomegaly, no splenomegaly PSYCHIATRIC: the  patient is alert & oriented to person, affect & behavior appropriate  LABS/RADIOLOGY:  10-14 HDL 38 otherwise fasting lipid panel normal 9-14 hemoglobin 10.4, MCV 85 otherwise CBC normal, BMP normal  ASSESSMENT/PLAN:  Atrial fibrillation-rate controlled Diabetes mellitus-uncontrolled. amaryl was increased. Hypertension-blood pressure is elevated. Will review a log Hyperlipidemia-well-controlled Anemia of chronic disease-stable Hypokalemia -- Will repleted Dementia-check TSH and vitamin B12 level Check liver profile  CPT CODE: 1610999309

## 2014-01-21 ENCOUNTER — Encounter (HOSPITAL_COMMUNITY): Payer: Self-pay | Admitting: Emergency Medicine

## 2014-01-21 ENCOUNTER — Emergency Department (HOSPITAL_COMMUNITY)
Admission: EM | Admit: 2014-01-21 | Discharge: 2014-01-22 | Disposition: A | Discharge status: Skilled Nursing Facility | Payer: Medicare Other | Attending: Emergency Medicine | Admitting: Emergency Medicine

## 2014-01-21 DIAGNOSIS — Z7902 Long term (current) use of antithrombotics/antiplatelets: Secondary | ICD-10-CM | POA: Insufficient documentation

## 2014-01-21 DIAGNOSIS — Z8673 Personal history of transient ischemic attack (TIA), and cerebral infarction without residual deficits: Secondary | ICD-10-CM | POA: Insufficient documentation

## 2014-01-21 DIAGNOSIS — I252 Old myocardial infarction: Secondary | ICD-10-CM | POA: Insufficient documentation

## 2014-01-21 DIAGNOSIS — Z7982 Long term (current) use of aspirin: Secondary | ICD-10-CM | POA: Insufficient documentation

## 2014-01-21 DIAGNOSIS — Z862 Personal history of diseases of the blood and blood-forming organs and certain disorders involving the immune mechanism: Secondary | ICD-10-CM | POA: Insufficient documentation

## 2014-01-21 DIAGNOSIS — Z8639 Personal history of other endocrine, nutritional and metabolic disease: Secondary | ICD-10-CM | POA: Insufficient documentation

## 2014-01-21 DIAGNOSIS — Z87891 Personal history of nicotine dependence: Secondary | ICD-10-CM | POA: Insufficient documentation

## 2014-01-21 DIAGNOSIS — Z79899 Other long term (current) drug therapy: Secondary | ICD-10-CM | POA: Insufficient documentation

## 2014-01-21 DIAGNOSIS — R319 Hematuria, unspecified: Secondary | ICD-10-CM

## 2014-01-21 LAB — CBC WITH DIFFERENTIAL/PLATELET
BASOS ABS: 0 10*3/uL (ref 0.0–0.1)
BASOS PCT: 0 % (ref 0–1)
EOS PCT: 2 % (ref 0–5)
Eosinophils Absolute: 0.1 10*3/uL (ref 0.0–0.7)
HCT: 31.3 % — ABNORMAL LOW (ref 39.0–52.0)
Hemoglobin: 10.1 g/dL — ABNORMAL LOW (ref 13.0–17.0)
Lymphocytes Relative: 30 % (ref 12–46)
Lymphs Abs: 1.3 10*3/uL (ref 0.7–4.0)
MCH: 28 pg (ref 26.0–34.0)
MCHC: 32.3 g/dL (ref 30.0–36.0)
MCV: 86.7 fL (ref 78.0–100.0)
MONO ABS: 0.4 10*3/uL (ref 0.1–1.0)
Monocytes Relative: 8 % (ref 3–12)
Neutro Abs: 2.8 10*3/uL (ref 1.7–7.7)
Neutrophils Relative %: 60 % (ref 43–77)
Platelets: 123 10*3/uL — ABNORMAL LOW (ref 150–400)
RBC: 3.61 MIL/uL — ABNORMAL LOW (ref 4.22–5.81)
RDW: 18.3 % — ABNORMAL HIGH (ref 11.5–15.5)
WBC: 4.6 10*3/uL (ref 4.0–10.5)

## 2014-01-21 LAB — URINALYSIS, ROUTINE W REFLEX MICROSCOPIC
Bilirubin Urine: NEGATIVE
Glucose, UA: NEGATIVE mg/dL
Ketones, ur: NEGATIVE mg/dL
NITRITE: NEGATIVE
Protein, ur: 100 mg/dL — AB
SPECIFIC GRAVITY, URINE: 1.008 (ref 1.005–1.030)
UROBILINOGEN UA: 1 mg/dL (ref 0.0–1.0)
pH: 8 (ref 5.0–8.0)

## 2014-01-21 LAB — BASIC METABOLIC PANEL
BUN: 28 mg/dL — ABNORMAL HIGH (ref 6–23)
CALCIUM: 9.7 mg/dL (ref 8.4–10.5)
CO2: 26 meq/L (ref 19–32)
CREATININE: 1.31 mg/dL (ref 0.50–1.35)
Chloride: 103 mEq/L (ref 96–112)
GFR calc non Af Amer: 50 mL/min — ABNORMAL LOW (ref >90)
GFR, EST AFRICAN AMERICAN: 58 mL/min — AB (ref >90)
Glucose, Bld: 77 mg/dL (ref 70–99)
Potassium: 3.9 mEq/L (ref 3.7–5.3)
Sodium: 144 mEq/L (ref 137–147)

## 2014-01-21 LAB — URINE MICROSCOPIC-ADD ON

## 2014-01-21 MED ORDER — LIDOCAINE HCL 2 % EX GEL
CUTANEOUS | Status: DC
Start: 2014-01-21 — End: 2014-01-22
  Filled 2014-01-21: qty 10

## 2014-01-21 MED ORDER — LIDOCAINE HCL 2 % EX GEL
Freq: Once | CUTANEOUS | Status: AC
  Administered 2014-01-22: 10 via URETHRAL

## 2014-01-21 MED ORDER — MORPHINE SULFATE 4 MG/ML IJ SOLN
4.0000 mg | Freq: Once | INTRAMUSCULAR | Status: AC
  Administered 2014-01-22: 4 mg via INTRAVENOUS
  Filled 2014-01-21: qty 1

## 2014-01-21 NOTE — ED Notes (Signed)
Bed: WA06 Expected date:  Expected time:  Means of arrival:  Comments: 80 yo penile bleeding

## 2014-01-21 NOTE — ED Notes (Addendum)
Per EMS [facility] for penile bleeding soaking his depends, and tremors onset this morning. No prior penile bleeding or tremor history, staff denies catheterizing patient. Pt has dementia at baseline, per EMS.  On assessment, small-moderate amount of gelatinous blood in diaper around penis and small-moderate amount of absorbed liquid blood in diaper.

## 2014-01-21 NOTE — Progress Notes (Signed)
CSW attempted to engage the patient but he was rest and not easy to wake.  CS[facility] and spoke with Sallye OberLouise as she reports the intent for the patient to return to them and his wife is aware of this ED admission.       Maryelizabeth Rowanressa Antoinett Dorman, MSW, LeonardLCSWA, 01/21/2014 Evening Clinical Social Worker [phone]

## 2014-01-21 NOTE — ED Provider Notes (Signed)
CSN: [id number]     Arrival date & time 01/21/14  1745 History   First MD Initiated Contact with Patient 01/21/14 1836     Chief Complaint  Patient presents with  . Hematuria     (Consider location/radiation/quality/duration/timing/severity/associated sxs/prior Treatment) Patient is a 80 y.o. male presenting with hematuria. The history is provided by the spouse and the patient.  Hematuria   patient here with hematuria x1 day. Denies any dysuria. No suprapubic fullness. No recent instrumentation. Symptoms persisted and nothing makes them better or worse. No treatment used prior to arrival. Patient does take Plavix but denies any other source of bleeding. No reports of colicky flank pain. No syncope or near-syncope. No new weakness in his legs.  Past Medical History  Diagnosis Date  . Gout   . Stroke   . Dementia   . Myocardial infarct    Past Surgical History  Procedure Laterality Date  . Back surgery     History reviewed. No pertinent family history. History  Substance Use Topics  . Smoking status: Former Smoker    Quit date: 09/12/1982  . Smokeless tobacco: Not on file  . Alcohol Use: No    Review of Systems  Genitourinary: Positive for hematuria.  All other systems reviewed and are negative.     Allergies  Review of patient's allergies indicates no known allergies.  Home Medications   Prior to Admission medications   Medication Sig Start Date End Date Taking? Authorizing Provider  aspirin EC 81 MG tablet Take 81 mg by mouth daily.   Yes Historical Provider, MD  atorvastatin (LIPITOR) 10 MG tablet Take 10 mg by mouth daily.   Yes Historical Provider, MD  carvedilol (COREG) 25 MG tablet Take 1 tablet (25 mg total) by mouth 2 (two) times daily with a meal. 05/31/13  Yes Lillia MountainJohn Joseph Griffin, MD  clopidogrel (PLAVIX) 75 MG tablet Take 75 mg by mouth daily.   Yes Historical Provider, MD  diltiazem (CARDIZEM CD) 180 MG 24 hr capsule Take 1 capsule (180 mg total) by  mouth daily. 05/31/13  Yes Lillia MountainJohn Joseph Griffin, MD  donepezil (ARICEPT) 10 MG tablet Take 10 mg by mouth at bedtime.   Yes Historical Provider, MD  glimepiride (AMARYL) 2 MG tablet Take 6 mg by mouth daily with breakfast.   Yes Historical Provider, MD  losartan-hydrochlorothiazide (HYZAAR) 50-12.5 MG per tablet Take 1 tablet by mouth daily.   Yes Historical Provider, MD  metFORMIN (GLUCOPHAGE) 850 MG tablet Take 850 mg by mouth 2 (two) times daily with a meal.   Yes Historical Provider, MD  pioglitazone (ACTOS) 45 MG tablet Take 45 mg by mouth daily.   Yes Historical Provider, MD  potassium chloride SA (K-DUR,KLOR-CON) 20 MEQ tablet Take 20 mEq by mouth daily.    Yes Historical Provider, MD   BP 152/63  Pulse 64  Temp(Src) 98.8 F (37.1 C) (Oral)  SpO2 94% Physical Exam  Nursing note and vitals reviewed. Constitutional: He is oriented to person, place, and time. He appears well-developed and well-nourished.  Non-toxic appearance. No distress.  HENT:  Head: Normocephalic and atraumatic.  Eyes: Conjunctivae, EOM and lids are normal. Pupils are equal, round, and reactive to light.  Neck: Normal range of motion. Neck supple. No tracheal deviation present. No mass present.  Cardiovascular: Normal rate, regular rhythm and normal heart sounds.  Exam reveals no gallop.   No murmur heard. Pulmonary/Chest: Effort normal and breath sounds normal. No stridor. No respiratory distress. He has no  decreased breath sounds. He has no wheezes. He has no rhonchi. He has no rales.  Abdominal: Soft. Normal appearance and bowel sounds are normal. He exhibits no distension. There is no tenderness. There is no rigidity, no rebound, no guarding and no CVA tenderness.  Genitourinary:  Blood at the meatus  Musculoskeletal: Normal range of motion. He exhibits no edema and no tenderness.  Neurological: He is alert and oriented to person, place, and time. He has normal strength. No cranial nerve deficit or sensory  deficit. GCS eye subscore is 4. GCS verbal subscore is 5. GCS motor subscore is 6.  Skin: Skin is warm and dry. No abrasion and no rash noted.  Psychiatric: He has a normal mood and affect. His speech is normal and behavior is normal.    ED Course  Procedures (including critical care time) Labs Review Labs Reviewed  URINE CULTURE  URINALYSIS, ROUTINE W REFLEX MICROSCOPIC  CBC WITH DIFFERENTIAL  BASIC METABOLIC PANEL    Imaging Review No results found.   EKG Interpretation   Date/Time:  Tuesday Jan 21 2014 18:02:29 EDT Ventricular Rate:  65 PR Interval:    QRS Duration: 88 QT Interval:  429 QTC Calculation: 446 R Axis:   67 Text Interpretation:  Junctional rhythm LVH with secondary repolarization  abnormality Anterior Q waves, possibly due to LVH Confirmed by Freida BusmanALLEN  MD,  Jolene Guyett ([id number]) on 01/21/2014 11:01:33 PM      MDM   Final diagnoses:  None    Patient had Foley placed by nursing staff. Bladder was irrigated and his bleeding is likely from his Plavix. Patient's family was offered to keep the Foley in place and be discharged home with urology followup. He would like the Foley catheter removed and patient to still receive urology followup    Toy BakerAnthony T Meztli Llanas, MD 01/21/14 2327

## 2014-01-21 NOTE — Discharge Instructions (Signed)
Hematuria, Adult °Hematuria is blood in your urine. It can be caused by a bladder infection, kidney infection, prostate infection, kidney stone, or cancer of your urinary tract. Infections can usually be treated with medicine, and a kidney stone usually will pass through your urine. If neither of these is the cause of your hematuria, further workup to find out the reason may be needed. °It is very important that you tell your health care provider about any blood you see in your urine, even if the blood stops without treatment or happens without causing pain. Blood in your urine that happens and then stops and then happens again can be a symptom of a very serious condition. Also, pain is not a symptom in the initial stages of many urinary cancers. °HOME CARE INSTRUCTIONS  °· Drink lots of fluid, 3 4 quarts a day. If you have been diagnosed with an infection, cranberry juice is especially recommended, in addition to large amounts of water. °· Avoid caffeine, tea, and carbonated beverages, because they tend to irritate the bladder. °· Avoid alcohol because it may irritate the prostate. °· Only take over-the-counter or prescription medicines for pain, discomfort, or fever as directed by your health care provider. °· If you have been diagnosed with a kidney stone, follow your health care provider's instructions regarding straining your urine to catch the stone. °· Empty your bladder often. Avoid holding urine for long periods of time. °· After a bowel movement, women should cleanse front to back. Use each tissue only once. °· Empty your bladder before and after sexual intercourse if you are a male. °SEEK MEDICAL CARE IF: °You develop back pain, fever, a feeling of sickness in your stomach (nausea), or vomiting or if your symptoms are not better in 3 days. Return sooner if you are getting worse. °SEEK IMMEDIATE MEDICAL CARE IF:  °· You have a persistent fever, with a temperature of 101.8°F (38.8°C) or greater. °· You  develop severe vomiting and are unable to keep the medicine down. °· You develop severe back or abdominal pain despite taking your medicines. °· You begin passing a large amount of blood or clots in your urine. °· You feel extremely weak or faint, or you pass out. °MAKE SURE YOU:  °· Understand these instructions. °· Will watch your condition. °· Will get help right away if you are not doing well or get worse. °Document Released: 08/29/2005 Document Revised: 06/19/2013 Document Reviewed: 04/29/2013 °ExitCare® Patient Information ©2014 [employer]. ° °

## 2014-01-22 NOTE — ED Notes (Signed)
Originally placed a 6316fr foley catheter in patient for irrigation. Irrigation not successful, drainage around the tube site. Catheter changed to a 20 fr. After several hours, pt was noted to have a wet hospital gown and some leaking around the catheter insertion site with some blood clots. MD made aware and verbal order for tube size to be increased. 22 fr was placed with no success. MD made aware and a second verbal order to increase tube size was placed. Pt now has 24 fr in place, minimal leaking around the catheter insertion site, drainage in bag noted.

## 2014-01-22 NOTE — ED Notes (Signed)
[facility] called for transport.  

## 2014-01-23 LAB — URINE CULTURE
Colony Count: NO GROWTH
Culture: NO GROWTH

## 2014-01-24 ENCOUNTER — Non-Acute Institutional Stay (SKILLED_NURSING_FACILITY): Payer: Medicare Other | Admitting: Internal Medicine

## 2014-01-24 DIAGNOSIS — R160 Hepatomegaly, not elsewhere classified: Secondary | ICD-10-CM

## 2014-01-24 DIAGNOSIS — R319 Hematuria, unspecified: Secondary | ICD-10-CM

## 2014-01-27 ENCOUNTER — Other Ambulatory Visit (HOSPITAL_BASED_OUTPATIENT_CLINIC_OR_DEPARTMENT_OTHER): Payer: Self-pay | Admitting: Internal Medicine

## 2014-01-27 ENCOUNTER — Other Ambulatory Visit: Payer: Self-pay | Admitting: *Deleted

## 2014-01-27 ENCOUNTER — Non-Acute Institutional Stay (SKILLED_NURSING_FACILITY): Payer: Medicare Other | Admitting: Internal Medicine

## 2014-01-27 DIAGNOSIS — I480 Paroxysmal atrial fibrillation: Secondary | ICD-10-CM

## 2014-01-27 DIAGNOSIS — I10 Essential (primary) hypertension: Secondary | ICD-10-CM

## 2014-01-27 DIAGNOSIS — R160 Hepatomegaly, not elsewhere classified: Secondary | ICD-10-CM

## 2014-01-27 DIAGNOSIS — E1165 Type 2 diabetes mellitus with hyperglycemia: Secondary | ICD-10-CM

## 2014-01-27 DIAGNOSIS — I4891 Unspecified atrial fibrillation: Secondary | ICD-10-CM

## 2014-01-27 DIAGNOSIS — R319 Hematuria, unspecified: Secondary | ICD-10-CM

## 2014-01-27 DIAGNOSIS — E7800 Pure hypercholesterolemia, unspecified: Secondary | ICD-10-CM

## 2014-01-27 DIAGNOSIS — IMO0001 Reserved for inherently not codable concepts without codable children: Secondary | ICD-10-CM

## 2014-01-27 MED ORDER — HYDROCODONE-ACETAMINOPHEN 5-325 MG PO TABS: 1.0000 | ORAL_TABLET | Freq: Four times a day (QID) | ORAL | Status: DC | PRN

## 2014-01-27 NOTE — Progress Notes (Signed)
         PROGRESS NOTE  DATE: 01-27-14  FACILITY: Nursing Home [facility]  LEVEL OF CARE: SNF (31)  Routine Visit  CHIEF COMPLAINT:  Manage atrial fibrillation, diabetes mellitus and hypertension  HISTORY OF PRESENT ILLNESS:  REASSESSMENT OF ONGOING PROBLEM(S):  ATRIAL FIBRILLATION: the patients atrial fibrillation remains stable.  The patient denies DOE, tachycardia, orthopnea, transient neurological sx, pedal edema, palpitations, & PNDs.  No complications noted from the medications currently being used.  DM:pt's DM remains stable.  Pt denies polyuria, polydipsia, polyphagia, changes in vision or hypoglycemic episodes.  No complications noted from the medication presently being used.  Last hemoglobin A1c is: 7.5 in 10/14.  HTN: Pt 's HTN remains stable.  Denies CP, sob, DOE, pedal edema, headaches, dizziness or visual disturbances.  No complications from the medications currently being used.  Last BP : 110/78, 110/68, 154/80, 134/60  PAST MEDICAL HISTORY : Reviewed.  No changes.  [facility]ICATIONS[facility]  REVIEW OF SYSTEMS:  GENERAL: no change in appetite, no fatigue, no weight changes, no fever, chills or weakness RESPIRATORY: no cough, SOB, DOE, wheezing, hemoptysis CARDIAC: no chest pain, edema or palpitations GI: no abdominal pain, diarrhea, constipation, heart burn, nausea or vomiting  PHYSICAL EXAMINATION  VS: See vital signs section  GENERAL: no acute distress, normal body habitus EYES: conjunctival erythema, sclerae normal, normal eye lids NECK: supple, trachea midline, no neck masses, no thyroid tenderness, no thyromegaly LYMPHATICS: no LAN in the neck, no supraclavicular LAN RESPIRATORY: breathing is even & unlabored, BS CTAB CARDIAC: RRR, no murmur,no extra heart sounds, no edema GI: abdomen soft, normal BS, no masses, no tenderness, no hepatomegaly, no splenomegaly PSYCHIATRIC: the patient is alert & oriented to person,  affect & behavior appropriate  LABS/RADIOLOGY: 5-15 hemoglobin 10.2, MCV 82, platelets 141, WBC 4.3 otherwise CMP normal 1-15 liver profile is normal, TSH 2.31, vitamin B12 level 419 10-14 HDL 38 otherwise fasting lipid panel normal 9-14 hemoglobin 10.4, MCV 85 otherwise CBC normal, BMP normal  ASSESSMENT/PLAN:  Atrial fibrillation-rate controlled Diabetes mellitus-uncontrolled. Check hemoglobin A1c Hypertension-well controlled Hyperlipidemia-well-controlled. Check fasting lipid panel Anemia of chronic disease-stable Hypokalemia -- Will repleted Dementia-stable Hematuria-new problem. renal ultrasound pending  CPT CODE: 1610999309  Newton PiggGayani Y. Kerry Doryasanayaka, MD [facility] [phone]

## 2014-01-27 NOTE — Telephone Encounter (Signed)
[facility] 

## 2014-01-27 NOTE — Addendum Note (Signed)
Addended by: Angela CoxASANAYAKA, Zymere Patlan Y on: 01/27/2014 05:34 PM   Modules accepted: Level of Service

## 2014-01-28 NOTE — Progress Notes (Addendum)
Patient ID: Darren Underwood, male   DOB: 10-Jul-1933, 80 y.o.   MRN: [id number]                   PROGRESS NOTE  DATE:  01/24/2014    [facility]    LEVEL OF CARE:   SNF   Acute Visit   CHIEF COMPLAINT:  Follow up hematuria.    HISTORY OF PRESENT ILLNESS:  I was asked to see this patient by his wife.  She had been concerned about blood in his catheter tubing.  I have reviewed this situation.    This  apparently started this week when it was noted that he had bleeding in his briefs.  This was initially of unclear origin, but later determined to be coming from his penis.   He complained of significant pain and he went to the ER for evaluation.  He has returned with a Foley catheter in place.    I have [facility].  I cannot really determine how much urine was obtained when he was catheterized, although the wife states the bag was "full".    He has since been to see Dr. Brunilda PayorNesi of Urology.  He still has the Foley catheter in place.  He still has hematuria, which I think is still the concern of his wife.    LABORATORY DATA:   I have reviewed the ER notes.  Lab work showed:   Sodium was 144, potassium of 3.9, BUN of 28, creatinine of 1.31.  These were higher than his previous values.    White count was 4.6, hemoglobin of 10.1, and a platelet count of 123.  Differential count was normal.    Urinalysis showed large hemoglobin, too numerous to count red cells.  His urine culture was negative.    PHYSICAL EXAMINATION:   GENERAL APPEARANCE:  The patient is not in any distress.   GASTROINTESTINAL:   LIVER/SPLEEN/KIDNEYS:  He appears to have a palpable liver edge, although this is smooth and mostly over to the right.    This may be a Riedel's lobe of the liver.  He does not have a palpable spleen.  There are no other masses.   GENITOURINARY:  BLADDER:   He has a Foley catheter in place.  This seems to be very painful for him, although I do not see a good reason  for this.  The catheter itself is draining light red urine.  There are no clots.    ASSESSMENT/PLAN:  Hematuria.  According to his wife, Urology did not feel this was a prostate issue.  He was supposed to have scheduled tests, although I do not see what these are.  I do not see a note from Urology in the chart, although there  apparently was one.  His urine culture was negative.  We do need to follow up on his hemoglobin and his basic metabolic panel, which I will order for Monday.    ?Hepatomegaly.  Among other issues with his hematuria, I think this man needs an ultrasound of his liver particularly, and also have a look at his upper urinary tract.  I do not see what Dr. Brunilda PayorNesi had in store for this man.  However, I think that we can move ahead with this order.  Presumably, he would need a cystoscopy, among other issues.

## 2014-01-29 ENCOUNTER — Ambulatory Visit (HOSPITAL_COMMUNITY): Payer: Medicare Other

## 2014-02-06 ENCOUNTER — Ambulatory Visit (HOSPITAL_COMMUNITY): Payer: Medicare Other

## 2014-02-19 ENCOUNTER — Non-Acute Institutional Stay (SKILLED_NURSING_FACILITY): Payer: Medicare Other | Admitting: Internal Medicine

## 2014-02-19 DIAGNOSIS — I480 Paroxysmal atrial fibrillation: Secondary | ICD-10-CM

## 2014-02-19 DIAGNOSIS — E119 Type 2 diabetes mellitus without complications: Secondary | ICD-10-CM

## 2014-02-19 DIAGNOSIS — I10 Essential (primary) hypertension: Secondary | ICD-10-CM

## 2014-02-19 DIAGNOSIS — I4891 Unspecified atrial fibrillation: Secondary | ICD-10-CM

## 2014-02-19 DIAGNOSIS — E7800 Pure hypercholesterolemia, unspecified: Secondary | ICD-10-CM

## 2014-02-19 NOTE — Progress Notes (Signed)
         PROGRESS NOTE  DATE: 02-19-14  FACILITY: Nursing Home Location: [facility]  LEVEL OF CARE: SNF (31)  Routine Visit  CHIEF COMPLAINT:  Manage atrial fibrillation, diabetes mellitus and hypertension  HISTORY OF PRESENT ILLNESS:  REASSESSMENT OF ONGOING PROBLEM(S):  ATRIAL FIBRILLATION: the patients atrial fibrillation remains stable.  The patient denies DOE, tachycardia, orthopnea, transient neurological sx, pedal edema, palpitations, & PNDs.  No complications noted from the medications currently being used.  DM:pt's DM remains stable.  Pt denies polyuria, polydipsia, polyphagia, changes in vision or hypoglycemic episodes.  No complications noted from the medication presently being used.  Last hemoglobin A1c is: 7.5 in 10/14, in 6/15 hemoglobin A1c 6.4.  HTN: Pt 's HTN remains stable.  Denies CP, sob, DOE, pedal edema, headaches, dizziness or visual disturbances.  No complications from the medications currently being used.  Last BP : 110/78, 110/68, 154/80, 134/60, 130/80  PAST MEDICAL HISTORY : Reviewed.  No changes.  CURRENT MEDICATIONS: Reviewed per [facility]  REVIEW OF SYSTEMS:  GENERAL: no change in appetite, no fatigue, no weight changes, no fever, chills or weakness RESPIRATORY: no cough, SOB, DOE, wheezing, hemoptysis CARDIAC: no chest pain, edema or palpitations GI: no abdominal pain, diarrhea, constipation, heart burn, nausea or vomiting  PHYSICAL EXAMINATION  VS: See vital signs section  GENERAL: no acute distress, normal body habitus EYES: conjunctival erythema, sclerae normal, normal eye lids NECK: supple, trachea midline, no neck masses, no thyroid tenderness, no thyromegaly LYMPHATICS: no LAN in the neck, no supraclavicular LAN RESPIRATORY: breathing is even & unlabored, BS CTAB CARDIAC: RRR, no murmur,no extra heart sounds, no edema GI: abdomen soft, normal BS, no masses, no tenderness, no hepatomegaly, no splenomegaly PSYCHIATRIC: the  patient is alert & oriented to person, affect & behavior appropriate  LABS/RADIOLOGY: 6-15 HDL 39 otherwise fasting lipid panel normal 5-15 hemoglobin 10.2, MCV 82, platelets 141, WBC 4.3 otherwise CMP normal 1-15 liver profile is normal, TSH 2.31, vitamin B12 level 419 10-14 HDL 38 otherwise fasting lipid panel normal 9-14 hemoglobin 10.4, MCV 85 otherwise CBC normal, BMP normal  ASSESSMENT/PLAN:  Atrial fibrillation-rate controlled Diabetes mellitus-well controlled Hypertension-well controlled Hyperlipidemia-well-controlled.  Anemia of chronic disease-stable Hypokalemia -- Will repleted Dementia-stable Hematuria-status post urology consult. A one-month followup planned.  CPT CODE: 12248  Darren Underwood. Kerry Dory, MD [facility] [phone]

## 2014-03-24 ENCOUNTER — Non-Acute Institutional Stay (SKILLED_NURSING_FACILITY): Payer: Medicare Other | Admitting: Internal Medicine

## 2014-03-24 DIAGNOSIS — I10 Essential (primary) hypertension: Secondary | ICD-10-CM

## 2014-03-24 DIAGNOSIS — I480 Paroxysmal atrial fibrillation: Secondary | ICD-10-CM

## 2014-03-24 DIAGNOSIS — E119 Type 2 diabetes mellitus without complications: Secondary | ICD-10-CM

## 2014-03-24 DIAGNOSIS — I4891 Unspecified atrial fibrillation: Secondary | ICD-10-CM

## 2014-03-24 DIAGNOSIS — E7800 Pure hypercholesterolemia, unspecified: Secondary | ICD-10-CM

## 2014-03-25 NOTE — Progress Notes (Signed)
         PROGRESS NOTE  DATE: 03-24-14  FACILITY: Nursing Home [facility]: [facility]  LEVEL OF CARE: SNF (31)  Routine Visit  CHIEF COMPLAINT:  Manage atrial fibrillation, diabetes mellitus and hypertension  HISTORY OF PRESENT ILLNESS:  REASSESSMENT OF ONGOING PROBLEM(S):  ATRIAL FIBRILLATION: the patients atrial fibrillation remains stable.  The patient denies DOE, tachycardia, orthopnea, transient neurological sx, pedal edema, palpitations, & PNDs.  No complications noted from the medications currently being used.  DM:pt's DM remains stable.  Pt denies polyuria, polydipsia, polyphagia, changes in vision or hypoglycemic episodes.  No complications noted from the medication presently being used.  Last hemoglobin A1c is: 7.5 in 10/14, in 6/15 hemoglobin A1c 6.4.  HTN: Pt 's HTN remains stable.  Denies CP, sob, DOE, pedal edema, headaches, dizziness or visual disturbances.  No complications from the medications currently being used.  Last BP : 110/78, 110/68, 154/80, 134/60, 130/80, 130/60  PAST MEDICAL HISTORY : Reviewed.  No changes.  CURRENT MEDICATIONS: [facility]  REVIEW OF SYSTEMS:  GENERAL: no change in appetite, no fatigue, no weight changes, no fever, chills or weakness RESPIRATORY: no cough, SOB, DOE, wheezing, hemoptysis CARDIAC: no chest pain, edema or palpitations GI: no abdominal pain, diarrhea, constipation, heart burn, nausea or vomiting  PHYSICAL EXAMINATION  VS: See vital signs section  GENERAL: no acute distress, normal body habitus EYES: conjunctival erythema, sclerae normal, normal eye lids NECK: supple, trachea midline, no neck masses, no thyroid tenderness, no thyromegaly LYMPHATICS: no LAN in the neck, no supraclavicular LAN RESPIRATORY: breathing is even & unlabored, BS CTAB CARDIAC: RRR, no murmur,no extra heart sounds, no edema GI: abdomen soft, normal BS, no masses, no tenderness, no hepatomegaly, no  splenomegaly PSYCHIATRIC: the patient is alert & oriented to person, affect & behavior appropriate  LABS/RADIOLOGY: 6-15 HDL 39 otherwise fasting lipid panel normal 5-15 hemoglobin 10.2, MCV 82, platelets 141, WBC 4.3 otherwise CMP normal 1-15 liver profile is normal, TSH 2.31, vitamin B12 level 419 10-14 HDL 38 otherwise fasting lipid panel normal 9-14 hemoglobin 10.4, MCV 85 otherwise CBC normal, BMP normal  ASSESSMENT/PLAN:  Atrial fibrillation-rate controlled Diabetes mellitus-well controlled Hypertension-well controlled Hyperlipidemia-well-controlled.  Anemia of chronic disease-stable Hypokalemia -- Well repleted Dementia-stable Bleeding gums-aspirin and Plavix on hold. Hemoglobin and iron studies pending. Dental consult pending.  CPT CODE: 1610999309  Newton PiggGayani Y. Kerry Doryasanayaka, MD [facility] [phone]

## 2014-04-28 ENCOUNTER — Non-Acute Institutional Stay (SKILLED_NURSING_FACILITY): Payer: Medicare Other | Admitting: Internal Medicine

## 2014-04-28 DIAGNOSIS — I4891 Unspecified atrial fibrillation: Secondary | ICD-10-CM

## 2014-04-28 DIAGNOSIS — E7800 Pure hypercholesterolemia, unspecified: Secondary | ICD-10-CM

## 2014-04-28 DIAGNOSIS — E119 Type 2 diabetes mellitus without complications: Secondary | ICD-10-CM

## 2014-04-28 DIAGNOSIS — I480 Paroxysmal atrial fibrillation: Secondary | ICD-10-CM

## 2014-04-28 DIAGNOSIS — I10 Essential (primary) hypertension: Secondary | ICD-10-CM

## 2014-04-29 NOTE — Progress Notes (Signed)
         PROGRESS NOTE  DATE: 04-28-14  FACILITY: Nursing Home [facility]  LEVEL OF CARE: SNF (31)  Routine Visit  CHIEF COMPLAINT:  Manage atrial fibrillation, diabetes mellitus and hypertension  HISTORY OF PRESENT ILLNESS:  REASSESSMENT OF ONGOING PROBLEM(S):  ATRIAL FIBRILLATION: the patients atrial fibrillation remains stable.  The patient denies DOE, tachycardia, orthopnea, transient neurological sx, pedal edema, palpitations, & PNDs.  No complications noted from the medications currently being used.  DM:pt's DM remains stable.  Pt denies polyuria, polydipsia, polyphagia, changes in vision or hypoglycemic episodes.  No complications noted from the medication presently being used.  Last hemoglobin A1c is: 7.5 in 10/14, in 6/15 hemoglobin A1c 6.4.  HTN: Pt 's HTN remains stable.  Denies CP, sob, DOE, pedal edema, headaches, dizziness or visual disturbances.  No complications from the medications currently being used.  Last BP : 110/78, 110/68, 154/80, 134/60, 130/80, 130/60, 110/70  PAST MEDICAL HISTORY : Reviewed.  No changes.  CURRENT MEDICATIONS: [facility]  REVIEW OF SYSTEMS:  GENERAL: no change in appetite, no fatigue, no weight changes, no fever, chills or weakness RESPIRATORY: no cough, SOB, DOE, wheezing, hemoptysis CARDIAC: no chest pain, edema or palpitations GI: no abdominal pain, diarrhea, constipation, heart burn, nausea or vomiting  PHYSICAL EXAMINATION  VS: See vital signs section  GENERAL: no acute distress, normal body habitus EYES: conjunctival erythema, sclerae normal, normal eye lids NECK: supple, trachea midline, no neck masses, no thyroid tenderness, no thyromegaly LYMPHATICS: no LAN in the neck, no supraclavicular LAN RESPIRATORY: breathing is even & unlabored, BS CTAB CARDIAC: RRR, no murmur,no extra heart sounds, no edema GI: abdomen soft, normal BS, no masses, no tenderness, no hepatomegaly, no  splenomegaly PSYCHIATRIC: the patient is alert & oriented to person, affect & behavior appropriate  LABS/RADIOLOGY: 7-15 hemoglobin 9.8, MCV 87, platelets 146, WBC 4.3, ferritin18 6-15 HDL 39 otherwise fasting lipid panel normal 5-15 hemoglobin 10.2, MCV 82, platelets 141, WBC 4.3 otherwise CMP normal 1-15 liver profile is normal, TSH 2.31, vitamin B12 level 419 10-14 HDL 38 otherwise fasting lipid panel normal 9-14 hemoglobin 10.4, MCV 85 otherwise CBC normal, BMP normal  ASSESSMENT/PLAN:  Atrial fibrillation-rate controlled Diabetes mellitus-well controlled Hypertension-well controlled Hyperlipidemia-well-controlled.  Iron deficiency anemia-new. Start ferrous sulfate 325 mg twice a day. Hypokalemia -- Well repleted Dementia-stable  CPT CODE: 1610999309  Newton PiggGayani Y. Kerry Doryasanayaka, MD [facility] [phone]

## 2014-05-28 ENCOUNTER — Non-Acute Institutional Stay (SKILLED_NURSING_FACILITY): Payer: Medicare Other | Admitting: Internal Medicine

## 2014-05-28 DIAGNOSIS — I480 Paroxysmal atrial fibrillation: Secondary | ICD-10-CM

## 2014-05-28 DIAGNOSIS — D630 Anemia in neoplastic disease: Secondary | ICD-10-CM

## 2014-05-28 DIAGNOSIS — I10 Essential (primary) hypertension: Secondary | ICD-10-CM

## 2014-05-28 DIAGNOSIS — E119 Type 2 diabetes mellitus without complications: Secondary | ICD-10-CM

## 2014-05-28 DIAGNOSIS — E7800 Pure hypercholesterolemia, unspecified: Secondary | ICD-10-CM

## 2014-05-28 DIAGNOSIS — I4891 Unspecified atrial fibrillation: Secondary | ICD-10-CM

## 2014-05-29 DIAGNOSIS — D630 Anemia in neoplastic disease: Secondary | ICD-10-CM | POA: Insufficient documentation

## 2014-05-29 NOTE — Progress Notes (Addendum)
         PROGRESS NOTE  DATE: 05-28-14  FACILITY: Nursing Home [facility]  LEVEL OF CARE: SNF (31)  Routine Visit  CHIEF COMPLAINT:  Manage atrial fibrillation, diabetes mellitus and hypertension  HISTORY OF PRESENT ILLNESS:  REASSESSMENT OF ONGOING PROBLEM(S):  ATRIAL FIBRILLATION: the patients atrial fibrillation remains stable.  The patient denies DOE, tachycardia, orthopnea, transient neurological sx, pedal edema, palpitations, & PNDs.  No complications noted from the medications currently being used.  DM:pt's DM remains stable.  Pt denies polyuria, polydipsia, polyphagia, changes in vision or hypoglycemic episodes. No complications noted from the medications currently being used. Last hemoglobin A1c is: 7.5 in 10/14, in 6/15 hemoglobin A1c 6.4.  HTN: Pt 's HTN remains stable.  Denies CP, sob, DOE, pedal edema, headaches, dizziness or visual disturbances.  No complications from the medications currently being used.  Last BP : 110/78, 110/68, 154/80, 134/60, 130/80, 130/60, 110/70, 122/84  PAST MEDICAL HISTORY : Reviewed.  No changes.  CURRENT MEDICATIONS: Reviewed per [facility]  REVIEW OF SYSTEMS:  GENERAL: no change in appetite, no fatigue, no weight changes, no fever, chills or weakness RESPIRATORY: no cough, SOB, DOE, wheezing, hemoptysis CARDIAC: no chest pain, edema or palpitations GI: no abdominal pain, diarrhea, constipation, heart burn, nausea or vomiting  PHYSICAL EXAMINATION  VS: See vital signs section  GENERAL: no acute distress, normal body habitus EYES: conjunctival erythema, sclerae normal, normal eye lids NECK: supple, trachea midline, no neck masses, no thyroid tenderness, no thyromegaly LYMPHATICS: no LAN in the neck, no supraclavicular LAN RESPIRATORY: breathing is even & unlabored, BS CTAB CARDIAC: RRR, no murmur,no extra heart sounds, no edema GI: abdomen soft, normal BS, no masses, no tenderness, no hepatomegaly, no  splenomegaly PSYCHIATRIC: the patient is alert & oriented to person, affect & behavior appropriate  LABS/RADIOLOGY: 7-15 hemoglobin 9.8, MCV 87, platelets 146, WBC 4.3, ferritin18 6-15 HDL 39 otherwise fasting lipid panel normal 5-15 hemoglobin 10.2, MCV 82, platelets 141, WBC 4.3 otherwise CMP normal 1-15 liver profile is normal, TSH 2.31, vitamin B12 level 419 10-14 HDL 38 otherwise fasting lipid panel normal 9-14 hemoglobin 10.4, MCV 85 otherwise CBC normal, BMP normal  ASSESSMENT/PLAN:  Atrial fibrillation-rate controlled Diabetes mellitus-well controlled.  Hypertension-well controlled Hyperlipidemia-well-controlled.  Iron deficiency anemia-continue iron Hypokalemia -- Well repleted Dementia-stable  CPT CODE: 16109  Newton Pigg. Kerry Dory, MD [facility] [phone]

## 2014-06-23 ENCOUNTER — Non-Acute Institutional Stay (SKILLED_NURSING_FACILITY): Payer: Medicare Other | Admitting: Internal Medicine

## 2014-06-23 DIAGNOSIS — I480 Paroxysmal atrial fibrillation: Secondary | ICD-10-CM

## 2014-06-23 DIAGNOSIS — E119 Type 2 diabetes mellitus without complications: Secondary | ICD-10-CM | POA: Insufficient documentation

## 2014-06-23 DIAGNOSIS — I10 Essential (primary) hypertension: Secondary | ICD-10-CM

## 2014-06-23 DIAGNOSIS — E7800 Pure hypercholesterolemia, unspecified: Secondary | ICD-10-CM

## 2014-06-23 NOTE — Progress Notes (Signed)
         PROGRESS NOTE  DATE: 06-23-14  FACILITY: Nursing Home [facility]  LEVEL OF CARE: SNF (31)  Routine Visit  CHIEF COMPLAINT:  Manage atrial fibrillation, diabetes mellitus and hypertension  HISTORY OF PRESENT ILLNESS:  REASSESSMENT OF ONGOING PROBLEM(S):  ATRIAL FIBRILLATION: the patients atrial fibrillation remains stable.  The patient denies DOE, tachycardia, orthopnea, transient neurological sx, pedal edema, palpitations, & PNDs.  No complications noted from the medications currently being used.  DM:pt's DM remains stable.  Pt denies polyuria, polydipsia, polyphagia, changes in vision or hypoglycemic episodes. No complications noted from the medications currently being used. Last hemoglobin A1c is: 7.5 in 10/14, in 6/15 hemoglobin A1c 6.4.  HTN: Pt 's HTN remains stable.  Denies CP, sob, DOE, pedal edema, headaches, dizziness or visual disturbances.  No complications from the medications currently being used.  Last BP : 110/78, 110/68, 154/80, 134/60, 130/80, 130/60, 110/70, 122/84, 150/82  PAST MEDICAL HISTORY : Reviewed.  No changes.  CURRENT MEDICATIONS: [facility]  REVIEW OF SYSTEMS:  GENERAL: no change in appetite, no fatigue, no weight changes, no fever, chills or weakness RESPIRATORY: no cough, SOB, DOE, wheezing, hemoptysis CARDIAC: no chest pain, edema or palpitations GI: no abdominal pain, diarrhea, constipation, heart burn, nausea or vomiting  PHYSICAL EXAMINATION  VS: See vital signs section  GENERAL: no acute distress, normal body habitus EYES: conjunctival erythema, sclerae normal, normal eye lids NECK: supple, trachea midline, no neck masses, no thyroid tenderness, no thyromegaly LYMPHATICS: no LAN in the neck, no supraclavicular LAN RESPIRATORY: breathing is even & unlabored, BS CTAB CARDIAC: RRR, no murmur,no extra heart sounds, no edema GI: abdomen soft, normal BS, no masses, no tenderness, no hepatomegaly, no  splenomegaly PSYCHIATRIC: the patient is alert & oriented to person, affect & behavior appropriate  LABS/RADIOLOGY: 7-15 hemoglobin 9.8, MCV 87, platelets 146, WBC 4.3, ferritin18 6-15 HDL 39 otherwise fasting lipid panel normal 5-15 hemoglobin 10.2, MCV 82, platelets 141, WBC 4.3 otherwise CMP normal 1-15 liver profile is normal, TSH 2.31, vitamin B12 level 419 10-14 HDL 38 otherwise fasting lipid panel normal 9-14 hemoglobin 10.4, MCV 85 otherwise CBC normal, BMP normal  ASSESSMENT/PLAN:  Atrial fibrillation-rate controlled Diabetes mellitus-well controlled.  Hypertension-last BP is elevated. Will review a log Hyperlipidemia-well-controlled.  Iron deficiency anemia-continue iron Hypokalemia -- Well repleted Dementia-stable  CPT CODE: 8469699309  Newton PiggGayani Y. Kerry Doryasanayaka, MD [facility] [phone]

## 2014-06-25 ENCOUNTER — Non-Acute Institutional Stay (SKILLED_NURSING_FACILITY): Payer: Medicare Other | Admitting: Internal Medicine

## 2014-06-25 DIAGNOSIS — I10 Essential (primary) hypertension: Secondary | ICD-10-CM

## 2014-07-01 NOTE — Progress Notes (Signed)
Patient ID: Darren Underwood, male   DOB: 1933/04/23, 80 y.o.   MRN: [id number]           PROGRESS NOTE  DATE: 06/25/2014          [facility]  LEVEL OF CARE: SNF (31)  Acute Visit  CHIEF COMPLAINT:  Manage hypertension.    HISTORY OF PRESENT ILLNESS: I was requested by the staff to assess the patient regarding above problem(s):  HTN: Pt 's blood pressure is uncontrolled.  Denies CP, sob, DOE, pedal edema, headaches, dizziness or visual disturbances.  No complications from the medications currently being used.  Last BP :   Systolic blood pressure ranges from 140-160 and diastolic is in the 80s.    PAST MEDICAL HISTORY : Reviewed.  No changes/see problem list  CURRENT MEDICATIONS: Reviewed per MAR/see medication list  REVIEW OF SYSTEMS:  GENERAL: no change in appetite, no fatigue, no weight changes, no fever, chills or weakness RESPIRATORY: no cough, SOB, DOE,, wheezing, hemoptysis CARDIAC: no chest pain, edema or palpitations GI: no abdominal pain, diarrhea, constipation, heart burn, nausea or vomiting  PHYSICAL EXAMINATION  VS: see VS section  GENERAL: no acute distress, normal body habitus NECK: supple, trachea midline, no neck masses, no thyroid tenderness, no thyromegaly RESPIRATORY: breathing is even & unlabored, BS CTAB CARDIAC: RRR, no murmur,no extra heart sounds, no edema GI: abdomen soft, normal BS, no masses, no tenderness, no hepatomegaly, no splenomegaly PSYCHIATRIC: the patient is alert & oriented to person, affect & behavior appropriate  ASSESSMENT/PLAN:  Hypertension.  Uncontrolled problem.  Start hydralazine 15 mg  t.i.d.    CPT CODE: 2130899308         Angela CoxGayani Y Dasanayaka, MD [facility] [phone]

## 2015-01-08 ENCOUNTER — Non-Acute Institutional Stay (SKILLED_NURSING_FACILITY): Payer: Medicare Other | Admitting: Internal Medicine

## 2015-01-08 ENCOUNTER — Encounter: Payer: Self-pay | Admitting: Internal Medicine

## 2015-01-08 DIAGNOSIS — K219 Gastro-esophageal reflux disease without esophagitis: Secondary | ICD-10-CM

## 2015-01-08 DIAGNOSIS — F0150 Vascular dementia without behavioral disturbance: Secondary | ICD-10-CM | POA: Diagnosis not present

## 2015-01-08 DIAGNOSIS — E119 Type 2 diabetes mellitus without complications: Secondary | ICD-10-CM

## 2015-01-08 DIAGNOSIS — I10 Essential (primary) hypertension: Secondary | ICD-10-CM

## 2015-01-08 DIAGNOSIS — M1A00X0 Idiopathic chronic gout, unspecified site, without tophus (tophi): Secondary | ICD-10-CM

## 2015-01-08 DIAGNOSIS — E785 Hyperlipidemia, unspecified: Secondary | ICD-10-CM

## 2015-01-08 DIAGNOSIS — I480 Paroxysmal atrial fibrillation: Secondary | ICD-10-CM | POA: Diagnosis not present

## 2015-01-08 NOTE — Progress Notes (Signed)
Patient ID: Darren Underwood, male   DOB: [birth year], 81 y.o.   MRN: [id number]   HISTORY AND PHYSICAL  01/08/15  Location:  [facility]    Place of Service: SNF [phone])   Extended Emergency Contact Information Primary Emergency Contact: Mastel,Juanita Address: [address]          [place] [address] [name] of [place] Phone: [phone] Mobile Phone: [phone] Relation: Spouse  Advanced Directive information  FULL CODE  Chief Complaint  Patient presents with  . New Admit To SNF    HPI:  81 yo male seen today as a new admission into SNF. He transferred from [facility]. He has a hx afib, DM II, vascular dementia, HTN and gout. CBG 142 yesterday. No low BS reactions. Denies numbness. He has no c/o. He is a poor historian due to dementia. Hx obtained from chart. No nursing issues. No falls since admission.  afib rate controlled on cardizem and coreg. He also takes plavix and ASA for anticoagulation  DM stable on actos, metformin, amaryl. Meals covered with novolog. CBG 93 today  He has chronic gout and takes colcrys and norco.  BP controlled with hydralazine, coreg, losartan HCT.   He takes lipitor for hyperlipidemia  GERD sx's controlled with zantac  Dementia stable with aricept  Past Medical History  Diagnosis Date  . Gout   . Stroke   . Dementia   . Myocardial infarct     Past Surgical History  Procedure Laterality Date  . Back surgery      Patient Care Team: Lavone Orn, MD as PCP - General (Internal Medicine)  History   Social History  . Marital Status: Single    Spouse Name: N/A  . Number of Children: N/A  . Years of Education: N/A   Occupational History  . Not on file.   Social History Main Topics  . Smoking status: Former Smoker    Quit date: 09/12/1982  . Smokeless tobacco: Not on file  . Alcohol Use: No  . Drug Use: No  . Sexual Activity: Not on file   Other Topics Concern  . Not on file    Social History Narrative     reports that he quit smoking about 32 years ago. He does not have any smokeless tobacco history on file. He reports that he does not drink alcohol or use illicit drugs.  No family history on file. No family status information on file.    There is no immunization history for the selected administration types on file for this patient.  No Known Allergies  Medications: Patient's Medications  New Prescriptions   No medications on file  Previous Medications   ASPIRIN EC 81 MG TABLET    Take 81 mg by mouth daily.   ATORVASTATIN (LIPITOR) 10 MG TABLET    Take 10 mg by mouth daily.   CARVEDILOL (COREG) 25 MG TABLET    Take 1 tablet (25 mg total) by mouth 2 (two) times daily with a meal.   CLOPIDOGREL (PLAVIX) 75 MG TABLET    Take 75 mg by mouth daily.   DILTIAZEM (CARDIZEM CD) 180 MG 24 HR CAPSULE    Take 1 capsule (180 mg total) by mouth daily.   DONEPEZIL (ARICEPT) 10 MG TABLET    Take 10 mg by mouth at bedtime.   GLIMEPIRIDE (AMARYL) 2 MG TABLET    Take 6 mg by mouth daily with breakfast.   HYDROCODONE-ACETAMINOPHEN (NORCO/VICODIN) 5-325 MG PER  TABLET    Take 1 tablet by mouth every 6 (six) hours as needed for moderate pain.   LOSARTAN-HYDROCHLOROTHIAZIDE (HYZAAR) 50-12.5 MG PER TABLET    Take 1 tablet by mouth daily.   METFORMIN (GLUCOPHAGE) 850 MG TABLET    Take 850 mg by mouth 2 (two) times daily with a meal.   PIOGLITAZONE (ACTOS) 45 MG TABLET    Take 45 mg by mouth daily.   POTASSIUM CHLORIDE SA (K-DUR,KLOR-CON) 20 MEQ TABLET    Take 20 mEq by mouth daily.   Modified Medications   No medications on file  Discontinued Medications   No medications on file    Review of Systems  Unable to perform ROS: Dementia     Filed Vitals:   01/08/15 1406  BP: 171/98  Pulse: 70  Temp: 97.7 F (36.5 C)  SpO2: 98%   There is no weight on file to calculate BMI.  Physical Exam  Constitutional: He appears well-developed.  Frail appearing. Lying in bed  in NAD. Easily aroused  HENT:  Mouth/Throat: Oropharynx is clear and moist.  Eyes: Pupils are equal, round, and reactive to light. No scleral icterus.  Neck: Neck supple. Carotid bruit is not present. No thyromegaly present.  Cardiovascular: Normal rate, regular rhythm and intact distal pulses.  Exam reveals no gallop and no friction rub.   Murmur (1/6 SEM) heard. no distal LE swelling. No calf TTP  Pulmonary/Chest: Effort normal and breath sounds normal. He has no wheezes. He has no rales. He exhibits no tenderness.  Abdominal: Soft. Bowel sounds are normal. He exhibits no distension, no abdominal bruit, no pulsatile midline mass and no mass. There is no tenderness. There is no rebound and no guarding.  Musculoskeletal: He exhibits edema and tenderness.  Lymphadenopathy:    He has no cervical adenopathy.  Neurological: He is alert.  Skin: Skin is warm and dry. No rash noted.  Psychiatric: He has a normal mood and affect. His behavior is normal.    Diabetic Foot Exam - Simple   Simple Foot Form  Diabetic Foot exam was performed with the following findings:  Yes 01/08/2015  5:29 PM  Visual Inspection  See comments:  Yes  Sensation Testing  Pulse Check  Posterior Tibialis and Dorsalis pulse intact bilaterally:  Yes  Comments  B/l toenail dystrophic changes; b/l hammertoes         Labs reviewed: No visits with results within 3 Month(s) from this visit. Latest known visit with results is:  Admission on 01/21/2014, Discharged on 01/22/2014  Component Date Value Ref Range Status  . Color, Urine 01/21/2014 RED* YELLOW Final   BIOCHEMICALS MAY BE AFFECTED BY COLOR  . APPearance 01/21/2014 CLOUDY* CLEAR Final  . Specific Gravity, Urine 01/21/2014 1.008  1.005 - 1.030 Final  . pH 01/21/2014 8.0  5.0 - 8.0 Final  . Glucose, UA 01/21/2014 NEGATIVE  NEGATIVE mg/dL Final  . Hgb urine dipstick 01/21/2014 LARGE* NEGATIVE Final  . Bilirubin Urine 01/21/2014 NEGATIVE  NEGATIVE Final  .  Ketones, ur 01/21/2014 NEGATIVE  NEGATIVE mg/dL Final  . Protein, ur 01/21/2014 100* NEGATIVE mg/dL Final  . Urobilinogen, UA 01/21/2014 1.0  0.0 - 1.0 mg/dL Final  . Nitrite 01/21/2014 NEGATIVE  NEGATIVE Final  . Leukocytes, UA 01/21/2014 TRACE* NEGATIVE Final  . WBC 01/21/2014 4.6  4.0 - 10.5 K/uL Final  . RBC 01/21/2014 3.61* 4.22 - 5.81 MIL/uL Final  . Hemoglobin 01/21/2014 10.1* 13.0 - 17.0 g/dL Final  . HCT 01/21/2014 31.3* 39.0 -  52.0 % Final  . MCV 01/21/2014 86.7  78.0 - 100.0 fL Final  . MCH 01/21/2014 28.0  26.0 - 34.0 pg Final  . MCHC 01/21/2014 32.3  30.0 - 36.0 g/dL Final  . RDW 01/21/2014 18.3* 11.5 - 15.5 % Final  . Platelets 01/21/2014 123* 150 - 400 K/uL Final  . Neutrophils Relative % 01/21/2014 60  43 - 77 % Final  . Neutro Abs 01/21/2014 2.8  1.7 - 7.7 K/uL Final  . Lymphocytes Relative 01/21/2014 30  12 - 46 % Final  . Lymphs Abs 01/21/2014 1.3  0.7 - 4.0 K/uL Final  . Monocytes Relative 01/21/2014 8  3 - 12 % Final  . Monocytes Absolute 01/21/2014 0.4  0.1 - 1.0 K/uL Final  . Eosinophils Relative 01/21/2014 2  0 - 5 % Final  . Eosinophils Absolute 01/21/2014 0.1  0.0 - 0.7 K/uL Final  . Basophils Relative 01/21/2014 0  0 - 1 % Final  . Basophils Absolute 01/21/2014 0.0  0.0 - 0.1 K/uL Final  . Sodium 01/21/2014 144  137 - 147 mEq/L Final  . Potassium 01/21/2014 3.9  3.7 - 5.3 mEq/L Final  . Chloride 01/21/2014 103  96 - 112 mEq/L Final  . CO2 01/21/2014 26  19 - 32 mEq/L Final  . Glucose, Bld 01/21/2014 77  70 - 99 mg/dL Final  . BUN 01/21/2014 28* 6 - 23 mg/dL Final  . Creatinine, Ser 01/21/2014 1.31  0.50 - 1.35 mg/dL Final  . Calcium 01/21/2014 9.7  8.4 - 10.5 mg/dL Final  . GFR calc non Af Amer 01/21/2014 50* >90 mL/min Final  . GFR calc Af Amer 01/21/2014 58* >90 mL/min Final   Comment: (NOTE)                          The eGFR has been calculated using the CKD EPI equation.                          This calculation has not been validated in all clinical  situations.                          eGFR's persistently <90 mL/min signify possible Chronic Kidney                          Disease.  Marland Kitchen Specimen Description 01/21/2014 URINE, CATHETERIZED   Final  . Special Requests 01/21/2014 NONE   Final  . Culture  Setup Time 01/21/2014    Final                   Value:01/22/2014 05:09                         Performed at [facility]  . Colony Count 01/21/2014    Final                   Value:NO GROWTH                         Performed at [facility]  . Culture 01/21/2014    Final                   Value:NO GROWTH  Performed at [facility]  . Report Status 01/21/2014 01/23/2014 FINAL   Final  . RBC / HPF 01/21/2014 TOO NUMEROUS TO COUNT  <3 RBC/hpf Final  . Urine-Other 01/21/2014 FIELD OBSCURED BY RBC'S   Final    No results found.   Assessment/Plan   ICD-9-CM ICD-10-CM   1. Vascular dementia, without behavioral disturbance - stable 290.40 F01.50   2. Essential hypertension, benign - controlled 401.1 I10   3. Paroxysmal a-fib - rate controlled 427.31 I48.0   4. Type 2 diabetes mellitus without complication - stable 242.68 E11.9   5. Hyperlipidemia LDL goal <100 - stable 272.4 E78.5   6. Idiopathic chronic gout without tophus, unspecified site - stable 274.02 [id number]   7. Gastroesophageal reflux disease without esophagitis - stable 530.81 K21.9     --cont current meds as ordered  --PT/OT/ST as indicated  --GOAL: short term rehab and transition to long term care. Communicated with pt and nursing.  --will follow  Mckinley Olheiser S. Perlie Gold  [facility] [address] [place], [place] [address] [phone] Office (Wednesdays and Fridays 8 AM - 5 PM) [phone] Cell (Monday-Friday 8 AM - 5 PM)

## 2015-01-29 ENCOUNTER — Non-Acute Institutional Stay (SKILLED_NURSING_FACILITY): Payer: Medicare Other | Admitting: Internal Medicine

## 2015-01-29 ENCOUNTER — Encounter: Payer: Self-pay | Admitting: Internal Medicine

## 2015-01-29 DIAGNOSIS — R5383 Other fatigue: Secondary | ICD-10-CM | POA: Diagnosis not present

## 2015-01-29 DIAGNOSIS — F0390 Unspecified dementia without behavioral disturbance: Secondary | ICD-10-CM

## 2015-01-29 DIAGNOSIS — M2550 Pain in unspecified joint: Secondary | ICD-10-CM

## 2015-01-29 NOTE — Progress Notes (Signed)
Patient ID: Darren Underwood, male   DOB: 01/28/1933, 81 y.o.   MRN: [id number]    DATE: 01/29/15  Location:  [facility]    Place of Service: [phone])   Extended Emergency Contact Information Primary Emergency Contact: Coffelt,Juanita Address: [address]          [place] [address] [name] of [place] Phone: [phone] Mobile Phone: [phone] Relation: Spouse  Advanced Directive information  FULL CODE  Chief Complaint  Patient presents with  . Acute Visit    discuss meds    HPI:  81 yo male sen today with his wife present to discuss medications. Wife c/a increased sleeping and decreased po intake over the last several days. Pt falls asleep when attempting to eat and is barely drinking liquids. Pt has no c/o. She is concerned that  His meds may be causing sx's. Reviewed med list. He is taking Norco BID ATC for arm pain. other meds he has been taking for years without a problem. He is losing weight due to decreased po intake. Pt is a poor historian due to dementia. Hx obtained from wife.  BS stable 130-160s. Rarely >200. No low BS reactions  Past Medical History  Diagnosis Date  . Gout   . Stroke   . Dementia   . Myocardial infarct     Past Surgical History  Procedure Laterality Date  . Back surgery      Patient Care Team: Lavone Orn, MD as PCP - General (Internal Medicine)  History   Social History  . Marital Status: Single    Spouse Name: N/A  . Number of Children: N/A  . Years of Education: N/A   Occupational History  . Not on file.   Social History Main Topics  . Smoking status: Former Smoker    Quit date: 09/12/1982  . Smokeless tobacco: Not on file  . Alcohol Use: No  . Drug Use: No  . Sexual Activity: Not on file   Other Topics Concern  . Not on file   Social History Narrative     reports that he quit smoking about 32 years ago. He does not have any smokeless tobacco history on file. He reports that  he does not drink alcohol or use illicit drugs.  There is no immunization history for the selected administration types on file for this patient.  No Known Allergies  Medications: Patient's Medications  New Prescriptions   No medications on file  Previous Medications   ASPIRIN EC 81 MG TABLET    Take 81 mg by mouth daily.   ATORVASTATIN (LIPITOR) 10 MG TABLET    Take 10 mg by mouth daily.   CARVEDILOL (COREG) 25 MG TABLET    Take 1 tablet (25 mg total) by mouth 2 (two) times daily with a meal.   CLOPIDOGREL (PLAVIX) 75 MG TABLET    Take 75 mg by mouth daily.   DILTIAZEM (CARDIZEM CD) 180 MG 24 HR CAPSULE    Take 1 capsule (180 mg total) by mouth daily.   DONEPEZIL (ARICEPT) 10 MG TABLET    Take 10 mg by mouth at bedtime.   GLIMEPIRIDE (AMARYL) 2 MG TABLET    Take 6 mg by mouth daily with breakfast.   HYDROCODONE-ACETAMINOPHEN (NORCO/VICODIN) 5-325 MG PER TABLET    Take 1 tablet by mouth every 6 (six) hours as needed for moderate pain.   LOSARTAN-HYDROCHLOROTHIAZIDE (HYZAAR) 50-12.5 MG PER TABLET    Take 1 tablet by  mouth daily.   METFORMIN (GLUCOPHAGE) 850 MG TABLET    Take 500 mg by mouth 2 (two) times daily with a meal.    PIOGLITAZONE (ACTOS) 45 MG TABLET    Take 45 mg by mouth daily.   POTASSIUM CHLORIDE SA (K-DUR,KLOR-CON) 20 MEQ TABLET    Take 20 mEq by mouth daily.   Modified Medications   No medications on file  Discontinued Medications   No medications on file    Review of Systems  Unable to perform ROS: Dementia    Filed Vitals:   01/29/15 1501  BP: 140/63  Pulse: 80   There is no weight on file to calculate BMI.  Physical Exam  Constitutional: He appears well-developed. He is sleeping.  Non-toxic appearance. He does not appear ill. No distress.  Frail appearing  Skin: Skin is warm and dry. No rash noted.  Psychiatric:  Unable to assess due to pt is asleep. He easily arouses when name called     Labs reviewed: No visits with results within 3 Month(s) from  this visit. Latest known visit with results is:  Admission on 01/21/2014, Discharged on 01/22/2014  Component Date Value Ref Range Status  . Color, Urine 01/21/2014 RED* YELLOW Final   BIOCHEMICALS MAY BE AFFECTED BY COLOR  . APPearance 01/21/2014 CLOUDY* CLEAR Final  . Specific Gravity, Urine 01/21/2014 1.008  1.005 - 1.030 Final  . pH 01/21/2014 8.0  5.0 - 8.0 Final  . Glucose, UA 01/21/2014 NEGATIVE  NEGATIVE mg/dL Final  . Hgb urine dipstick 01/21/2014 LARGE* NEGATIVE Final  . Bilirubin Urine 01/21/2014 NEGATIVE  NEGATIVE Final  . Ketones, ur 01/21/2014 NEGATIVE  NEGATIVE mg/dL Final  . Protein, ur 01/21/2014 100* NEGATIVE mg/dL Final  . Urobilinogen, UA 01/21/2014 1.0  0.0 - 1.0 mg/dL Final  . Nitrite 01/21/2014 NEGATIVE  NEGATIVE Final  . Leukocytes, UA 01/21/2014 TRACE* NEGATIVE Final  . WBC 01/21/2014 4.6  4.0 - 10.5 K/uL Final  . RBC 01/21/2014 3.61* 4.22 - 5.81 MIL/uL Final  . Hemoglobin 01/21/2014 10.1* 13.0 - 17.0 g/dL Final  . HCT 01/21/2014 31.3* 39.0 - 52.0 % Final  . MCV 01/21/2014 86.7  78.0 - 100.0 fL Final  . MCH 01/21/2014 28.0  26.0 - 34.0 pg Final  . MCHC 01/21/2014 32.3  30.0 - 36.0 g/dL Final  . RDW 01/21/2014 18.3* 11.5 - 15.5 % Final  . Platelets 01/21/2014 123* 150 - 400 K/uL Final  . Neutrophils Relative % 01/21/2014 60  43 - 77 % Final  . Neutro Abs 01/21/2014 2.8  1.7 - 7.7 K/uL Final  . Lymphocytes Relative 01/21/2014 30  12 - 46 % Final  . Lymphs Abs 01/21/2014 1.3  0.7 - 4.0 K/uL Final  . Monocytes Relative 01/21/2014 8  3 - 12 % Final  . Monocytes Absolute 01/21/2014 0.4  0.1 - 1.0 K/uL Final  . Eosinophils Relative 01/21/2014 2  0 - 5 % Final  . Eosinophils Absolute 01/21/2014 0.1  0.0 - 0.7 K/uL Final  . Basophils Relative 01/21/2014 0  0 - 1 % Final  . Basophils Absolute 01/21/2014 0.0  0.0 - 0.1 K/uL Final  . Sodium 01/21/2014 144  137 - 147 mEq/L Final  . Potassium 01/21/2014 3.9  3.7 - 5.3 mEq/L Final  . Chloride 01/21/2014 103  96 - 112  mEq/L Final  . CO2 01/21/2014 26  19 - 32 mEq/L Final  . Glucose, Bld 01/21/2014 77  70 - 99 mg/dL Final  . BUN 01/21/2014  28* 6 - 23 mg/dL Final  . Creatinine, Ser 01/21/2014 1.31  0.50 - 1.35 mg/dL Final  . Calcium 01/21/2014 9.7  8.4 - 10.5 mg/dL Final  . GFR calc non Af Amer 01/21/2014 50* >90 mL/min Final  . GFR calc Af Amer 01/21/2014 58* >90 mL/min Final   Comment: (NOTE)                          The eGFR has been calculated using the CKD EPI equation.                          This calculation has not been validated in all clinical situations.                          eGFR's persistently <90 mL/min signify possible Chronic Kidney                          Disease.  Marland Kitchen Specimen Description 01/21/2014 URINE, CATHETERIZED   Final  . Special Requests 01/21/2014 NONE   Final  . Culture  Setup Time 01/21/2014    Final                   Value:01/22/2014 05:09                         Performed at [facility]  . Colony Count 01/21/2014    Final                   Value:NO GROWTH                         Performed at [facility]  . Culture 01/21/2014    Final                   Value:NO GROWTH                         Performed at [facility]  . Report Status 01/21/2014 01/23/2014 FINAL   Final  . RBC / HPF 01/21/2014 TOO NUMEROUS TO COUNT  <3 RBC/hpf Final  . Urine-Other 01/21/2014 FIELD OBSCURED BY RBC'S   Final    No results found.   Assessment/Plan   ICD-9-CM ICD-10-CM   1. Lethargy - med induced 780.79 R53.83   2. Dementia, without behavioral disturbance - cont aricept 294.20 F03.90   3. Pain in joint involving multiple sites 719.49 M25.50     --reduce norco to 1 tab po qhs  --start Tylenol ES 519m 2 tabs po QAM  --will follow. Continue other meds as ordered   [facility] CPerlie Gold PRiver[facility] Systemand Adult [facility] [address] Edgewater StreetGPomona [place] [address]([phone] (Monday-Friday 8 AM - 5 PM) ([phone] After 5 PM and follow prompts

## 2015-02-01 ENCOUNTER — Inpatient Hospital Stay (HOSPITAL_COMMUNITY)
Admission: EM | Admit: 2015-02-01 | Discharge: 2015-02-11 | DRG: 682 | Disposition: E | Payer: Medicare Other | Attending: Internal Medicine | Admitting: Internal Medicine

## 2015-02-01 ENCOUNTER — Encounter (HOSPITAL_COMMUNITY): Payer: Self-pay | Admitting: Emergency Medicine

## 2015-02-01 ENCOUNTER — Emergency Department (HOSPITAL_COMMUNITY): Payer: Medicare Other

## 2015-02-01 DIAGNOSIS — I4891 Unspecified atrial fibrillation: Secondary | ICD-10-CM | POA: Diagnosis present

## 2015-02-01 DIAGNOSIS — Z66 Do not resuscitate: Secondary | ICD-10-CM | POA: Diagnosis present

## 2015-02-01 DIAGNOSIS — R748 Abnormal levels of other serum enzymes: Secondary | ICD-10-CM | POA: Diagnosis present

## 2015-02-01 DIAGNOSIS — R627 Adult failure to thrive: Secondary | ICD-10-CM | POA: Diagnosis present

## 2015-02-01 DIAGNOSIS — F0390 Unspecified dementia without behavioral disturbance: Secondary | ICD-10-CM | POA: Diagnosis present

## 2015-02-01 DIAGNOSIS — B961 Klebsiella pneumoniae [K. pneumoniae] as the cause of diseases classified elsewhere: Secondary | ICD-10-CM | POA: Diagnosis present

## 2015-02-01 DIAGNOSIS — I4581 Long QT syndrome: Secondary | ICD-10-CM | POA: Diagnosis present

## 2015-02-01 DIAGNOSIS — E875 Hyperkalemia: Secondary | ICD-10-CM | POA: Diagnosis present

## 2015-02-01 DIAGNOSIS — R778 Other specified abnormalities of plasma proteins: Secondary | ICD-10-CM

## 2015-02-01 DIAGNOSIS — E46 Unspecified protein-calorie malnutrition: Secondary | ICD-10-CM | POA: Diagnosis present

## 2015-02-01 DIAGNOSIS — Z87891 Personal history of nicotine dependence: Secondary | ICD-10-CM | POA: Diagnosis not present

## 2015-02-01 DIAGNOSIS — I2510 Atherosclerotic heart disease of native coronary artery without angina pectoris: Secondary | ICD-10-CM | POA: Diagnosis present

## 2015-02-01 DIAGNOSIS — Z79899 Other long term (current) drug therapy: Secondary | ICD-10-CM

## 2015-02-01 DIAGNOSIS — Z8673 Personal history of transient ischemic attack (TIA), and cerebral infarction without residual deficits: Secondary | ICD-10-CM | POA: Diagnosis not present

## 2015-02-01 DIAGNOSIS — I10 Essential (primary) hypertension: Secondary | ICD-10-CM | POA: Diagnosis present

## 2015-02-01 DIAGNOSIS — N179 Acute kidney failure, unspecified: Principal | ICD-10-CM | POA: Diagnosis present

## 2015-02-01 DIAGNOSIS — E119 Type 2 diabetes mellitus without complications: Secondary | ICD-10-CM | POA: Diagnosis present

## 2015-02-01 DIAGNOSIS — I248 Other forms of acute ischemic heart disease: Secondary | ICD-10-CM | POA: Diagnosis present

## 2015-02-01 DIAGNOSIS — G9349 Other encephalopathy: Secondary | ICD-10-CM | POA: Diagnosis present

## 2015-02-01 DIAGNOSIS — Z7982 Long term (current) use of aspirin: Secondary | ICD-10-CM | POA: Diagnosis not present

## 2015-02-01 DIAGNOSIS — Z794 Long term (current) use of insulin: Secondary | ICD-10-CM | POA: Diagnosis not present

## 2015-02-01 DIAGNOSIS — E860 Dehydration: Secondary | ICD-10-CM | POA: Diagnosis present

## 2015-02-01 DIAGNOSIS — N19 Unspecified kidney failure: Secondary | ICD-10-CM | POA: Diagnosis present

## 2015-02-01 DIAGNOSIS — E870 Hyperosmolality and hypernatremia: Secondary | ICD-10-CM | POA: Diagnosis present

## 2015-02-01 DIAGNOSIS — R4182 Altered mental status, unspecified: Secondary | ICD-10-CM

## 2015-02-01 DIAGNOSIS — N390 Urinary tract infection, site not specified: Secondary | ICD-10-CM | POA: Diagnosis present

## 2015-02-01 DIAGNOSIS — Z8041 Family history of malignant neoplasm of ovary: Secondary | ICD-10-CM

## 2015-02-01 DIAGNOSIS — R001 Bradycardia, unspecified: Secondary | ICD-10-CM | POA: Diagnosis present

## 2015-02-01 DIAGNOSIS — Z833 Family history of diabetes mellitus: Secondary | ICD-10-CM

## 2015-02-01 DIAGNOSIS — I252 Old myocardial infarction: Secondary | ICD-10-CM

## 2015-02-01 DIAGNOSIS — Z515 Encounter for palliative care: Secondary | ICD-10-CM | POA: Diagnosis not present

## 2015-02-01 DIAGNOSIS — B3749 Other urogenital candidiasis: Secondary | ICD-10-CM | POA: Diagnosis not present

## 2015-02-01 DIAGNOSIS — G9340 Encephalopathy, unspecified: Secondary | ICD-10-CM | POA: Diagnosis not present

## 2015-02-01 DIAGNOSIS — Z681 Body mass index (BMI) 19 or less, adult: Secondary | ICD-10-CM | POA: Diagnosis not present

## 2015-02-01 DIAGNOSIS — M109 Gout, unspecified: Secondary | ICD-10-CM | POA: Diagnosis present

## 2015-02-01 DIAGNOSIS — R7989 Other specified abnormal findings of blood chemistry: Secondary | ICD-10-CM

## 2015-02-01 DIAGNOSIS — I249 Acute ischemic heart disease, unspecified: Secondary | ICD-10-CM | POA: Diagnosis not present

## 2015-02-01 LAB — BASIC METABOLIC PANEL
ANION GAP: 14 (ref 5–15)
BUN: 78 mg/dL — AB (ref 6–20)
CALCIUM: 8.5 mg/dL — AB (ref 8.9–10.3)
CHLORIDE: 119 mmol/L — AB (ref 101–111)
CO2: 20 mmol/L — ABNORMAL LOW (ref 22–32)
Creatinine, Ser: 2.86 mg/dL — ABNORMAL HIGH (ref 0.61–1.24)
GFR, EST AFRICAN AMERICAN: 23 mL/min — AB (ref >60)
GFR, EST NON AFRICAN AMERICAN: 19 mL/min — AB (ref >60)
Glucose, Bld: 119 mg/dL — ABNORMAL HIGH (ref 65–99)
Potassium: 4.7 mmol/L (ref 3.5–5.1)
Sodium: 153 mmol/L — ABNORMAL HIGH (ref 135–145)

## 2015-02-01 LAB — GLUCOSE, CAPILLARY
Glucose-Capillary: 137 mg/dL — ABNORMAL HIGH (ref 65–99)
Glucose-Capillary: 121 mg/dL — ABNORMAL HIGH (ref 65–99)

## 2015-02-01 LAB — COMPREHENSIVE METABOLIC PANEL
ALT: 37 U/L (ref 17–63)
ANION GAP: 17 — AB (ref 5–15)
AST: 43 U/L — ABNORMAL HIGH (ref 15–41)
Albumin: 3.7 g/dL (ref 3.5–5.0)
Alkaline Phosphatase: 162 U/L — ABNORMAL HIGH (ref 38–126)
BILIRUBIN TOTAL: 0.8 mg/dL (ref 0.3–1.2)
BUN: 76 mg/dL — AB (ref 6–20)
CO2: 18 mmol/L — ABNORMAL LOW (ref 22–32)
CREATININE: 3.13 mg/dL — AB (ref 0.61–1.24)
Calcium: 9.1 mg/dL (ref 8.9–10.3)
Chloride: 115 mmol/L — ABNORMAL HIGH (ref 101–111)
GFR calc Af Amer: 20 mL/min — ABNORMAL LOW (ref >60)
GFR calc non Af Amer: 17 mL/min — ABNORMAL LOW (ref >60)
Glucose, Bld: 177 mg/dL — ABNORMAL HIGH (ref 65–99)
Potassium: 5.5 mmol/L — ABNORMAL HIGH (ref 3.5–5.1)
Sodium: 150 mmol/L — ABNORMAL HIGH (ref 135–145)
Total Protein: 7.3 g/dL (ref 6.5–8.1)

## 2015-02-01 LAB — CBC WITH DIFFERENTIAL/PLATELET
Basophils Absolute: 0 10*3/uL (ref 0.0–0.1)
Basophils Relative: 0 % (ref 0–1)
Eosinophils Absolute: 0 10*3/uL (ref 0.0–0.7)
Eosinophils Relative: 0 % (ref 0–5)
HEMATOCRIT: 45.1 % (ref 39.0–52.0)
HEMOGLOBIN: 14.5 g/dL (ref 13.0–17.0)
Lymphocytes Relative: 18 % (ref 12–46)
Lymphs Abs: 0.8 10*3/uL (ref 0.7–4.0)
MCH: 27.7 pg (ref 26.0–34.0)
MCHC: 32.2 g/dL (ref 30.0–36.0)
MCV: 86.1 fL (ref 78.0–100.0)
Monocytes Absolute: 0.4 10*3/uL (ref 0.1–1.0)
Monocytes Relative: 9 % (ref 3–12)
Neutro Abs: 3.3 10*3/uL (ref 1.7–7.7)
Neutrophils Relative %: 73 % (ref 43–77)
Platelets: 105 10*3/uL — ABNORMAL LOW (ref 150–400)
RBC: 5.24 MIL/uL (ref 4.22–5.81)
RDW: 17.6 % — ABNORMAL HIGH (ref 11.5–15.5)
WBC: 4.5 10*3/uL (ref 4.0–10.5)

## 2015-02-01 LAB — CBC
HEMATOCRIT: 42.2 % (ref 39.0–52.0)
HEMOGLOBIN: 13.8 g/dL (ref 13.0–17.0)
MCH: 28 pg (ref 26.0–34.0)
MCHC: 32.7 g/dL (ref 30.0–36.0)
MCV: 85.8 fL (ref 78.0–100.0)
Platelets: 115 10*3/uL — ABNORMAL LOW (ref 150–400)
RBC: 4.92 MIL/uL (ref 4.22–5.81)
RDW: 17.4 % — ABNORMAL HIGH (ref 11.5–15.5)
WBC: 3.9 10*3/uL — AB (ref 4.0–10.5)

## 2015-02-01 LAB — URINALYSIS, ROUTINE W REFLEX MICROSCOPIC
Glucose, UA: NEGATIVE mg/dL
KETONES UR: 15 mg/dL — AB
Nitrite: NEGATIVE
Protein, ur: >300 mg/dL — AB
Specific Gravity, Urine: 1.02 (ref 1.005–1.030)
Urobilinogen, UA: 1 mg/dL (ref 0.0–1.0)
pH: 5.5 (ref 5.0–8.0)

## 2015-02-01 LAB — URINE MICROSCOPIC-ADD ON

## 2015-02-01 LAB — CREATININE, SERUM
Creatinine, Ser: 3.18 mg/dL — ABNORMAL HIGH (ref 0.61–1.24)
GFR calc Af Amer: 20 mL/min — ABNORMAL LOW (ref >60)
GFR calc non Af Amer: 17 mL/min — ABNORMAL LOW (ref >60)

## 2015-02-01 LAB — TROPONIN I
Troponin I: 0.05 ng/mL — ABNORMAL HIGH (ref <0.031)
Troponin I: 0.06 ng/mL — ABNORMAL HIGH (ref <0.031)
Troponin I: 0.06 ng/mL — ABNORMAL HIGH (ref <0.031)

## 2015-02-01 LAB — MAGNESIUM: MAGNESIUM: 2 mg/dL (ref 1.7–2.4)

## 2015-02-01 MED ORDER — ENOXAPARIN SODIUM 30 MG/0.3ML ~~LOC~~ SOLN
30.0000 mg | SUBCUTANEOUS | Status: DC
  Administered 2015-02-01: 30 mg via SUBCUTANEOUS
  Filled 2015-02-01: qty 0.3

## 2015-02-01 MED ORDER — CARVEDILOL 25 MG PO TABS: 25.0000 mg | ORAL_TABLET | Freq: Two times a day (BID) | ORAL | Status: DC

## 2015-02-01 MED ORDER — CETYLPYRIDINIUM CHLORIDE 0.05 % MT LIQD
7.0000 mL | Freq: Two times a day (BID) | OROMUCOSAL | Status: DC
  Administered 2015-02-01 – 2015-02-02 (×3): 7 mL via OROMUCOSAL

## 2015-02-01 MED ORDER — ACETAMINOPHEN 325 MG PO TABS: 650.0000 mg | ORAL_TABLET | Freq: Four times a day (QID) | ORAL | Status: DC | PRN

## 2015-02-01 MED ORDER — ACETAMINOPHEN 650 MG RE SUPP
650.0000 mg | Freq: Four times a day (QID) | RECTAL | Status: DC | PRN
  Administered 2015-02-02: 650 mg via RECTAL
  Filled 2015-02-01: qty 1

## 2015-02-01 MED ORDER — SODIUM BICARBONATE 8.4 % IV SOLN
50.0000 meq | Freq: Once | INTRAVENOUS | Status: AC
  Administered 2015-02-01: 50 meq via INTRAVENOUS
  Filled 2015-02-01: qty 50

## 2015-02-01 MED ORDER — ACETAMINOPHEN 500 MG PO TABS: 1000.0000 mg | ORAL_TABLET | Freq: Every morning | ORAL | Status: DC

## 2015-02-01 MED ORDER — DONEPEZIL HCL 10 MG PO TABS
10.0000 mg | ORAL_TABLET | Freq: Every day | ORAL | Status: DC
  Filled 2015-02-01 (×3): qty 1
  Filled 2015-02-01: qty 2

## 2015-02-01 MED ORDER — SODIUM CHLORIDE 0.9 % IV BOLUS (SEPSIS)
1000.0000 mL | INTRAVENOUS | Status: AC
  Administered 2015-02-01: 1000 mL via INTRAVENOUS

## 2015-02-01 MED ORDER — SODIUM CHLORIDE 0.9 % IV SOLN
1.0000 g | Freq: Once | INTRAVENOUS | Status: AC
  Administered 2015-02-01: 1 g via INTRAVENOUS
  Filled 2015-02-01: qty 10

## 2015-02-01 MED ORDER — CHLORHEXIDINE GLUCONATE 0.12 % MT SOLN
15.0000 mL | Freq: Two times a day (BID) | OROMUCOSAL | Status: DC
  Administered 2015-02-01 – 2015-02-02 (×3): 15 mL via OROMUCOSAL
  Filled 2015-02-01 (×2): qty 15

## 2015-02-01 MED ORDER — SODIUM CHLORIDE 0.9 % IV SOLN
INTRAVENOUS | Status: DC
  Administered 2015-02-01 – 2015-02-02 (×2): via INTRAVENOUS

## 2015-02-01 MED ORDER — SODIUM CHLORIDE 0.9 % IV SOLN: INTRAVENOUS | Status: DC

## 2015-02-01 MED ORDER — SODIUM CHLORIDE 0.9 % IV BOLUS (SEPSIS): 500.0000 mL | INTRAVENOUS | Status: DC

## 2015-02-01 MED ORDER — ONDANSETRON HCL 4 MG/2ML IJ SOLN: 4.0000 mg | Freq: Four times a day (QID) | INTRAMUSCULAR | Status: DC | PRN

## 2015-02-01 MED ORDER — CLOPIDOGREL BISULFATE 75 MG PO TABS: 75.0000 mg | ORAL_TABLET | Freq: Every morning | ORAL | Status: DC

## 2015-02-01 MED ORDER — INSULIN ASPART 100 UNIT/ML ~~LOC~~ SOLN
0.0000 [IU] | Freq: Three times a day (TID) | SUBCUTANEOUS | Status: DC
  Administered 2015-02-01: 1 [IU] via SUBCUTANEOUS

## 2015-02-01 MED ORDER — COLCHICINE 0.6 MG PO TABS
0.6000 mg | ORAL_TABLET | Freq: Two times a day (BID) | ORAL | Status: DC
  Filled 2015-02-01: qty 1

## 2015-02-01 MED ORDER — ONDANSETRON HCL 4 MG PO TABS: 4.0000 mg | ORAL_TABLET | Freq: Four times a day (QID) | ORAL | Status: DC | PRN

## 2015-02-01 NOTE — ED Provider Notes (Signed)
CSN: [id number]     Arrival date & time 01/31/2015  1104 History   First MD Initiated Contact with Patient 01/12/2015 1109     Chief Complaint  Patient presents with  . Failure To Thrive     (Consider location/radiation/quality/duration/timing/severity/associated sxs/prior Treatment) HPI Comments:  81 y.o. male w hx of CVA, MI, dementia who presents from [place] living with decreased oral intake for the last week. Patient reportedly not taking his medications as well. Reportedly nonverbal at baseline. The duration of his symptoms is constant. The severity is mild to moderate. He has not had similar symptoms in the past. No other associated symptoms. Nothing is relieving his symptoms as far.  Level V caveat due to nonverbal patient.  The history is provided by the EMS personnel.    Past Medical History  Diagnosis Date  . Gout   . Stroke   . Dementia   . Myocardial infarct    Past Surgical History  Procedure Laterality Date  . Back surgery     History reviewed. No pertinent family history. History  Substance Use Topics  . Smoking status: Former Smoker    Quit date: 09/12/1982  . Smokeless tobacco: Not on file  . Alcohol Use: No    Review of Systems  Constitutional: Negative for fever.  HENT: Negative for drooling and rhinorrhea.   Eyes: Negative for pain.  Respiratory: Negative for cough and shortness of breath.   Cardiovascular: Negative for chest pain and leg swelling.  Gastrointestinal: Negative for nausea, vomiting, abdominal pain and diarrhea.  Genitourinary: Negative for dysuria and hematuria.  Musculoskeletal: Negative for gait problem and neck pain.  Skin: Negative for color change.  Neurological: Negative for numbness and headaches.  Hematological: Negative for adenopathy.  Psychiatric/Behavioral: Negative for behavioral problems.  All other systems reviewed and are negative.     Allergies  Review of patient's allergies indicates no known allergies.  Home  Medications   Prior to Admission medications   Medication Sig Start Date End Date Taking? Authorizing Provider  aspirin EC 81 MG tablet Take 81 mg by mouth daily.    Historical Provider, MD  atorvastatin (LIPITOR) 10 MG tablet Take 10 mg by mouth daily.    Historical Provider, MD  carvedilol (COREG) 25 MG tablet Take 1 tablet (25 mg total) by mouth 2 (two) times daily with a meal. 05/31/13   Kirby Funk, MD  clopidogrel (PLAVIX) 75 MG tablet Take 75 mg by mouth daily.    Historical Provider, MD  diltiazem (CARDIZEM CD) 180 MG 24 hr capsule Take 1 capsule (180 mg total) by mouth daily. 05/31/13   Kirby Funk, MD  donepezil (ARICEPT) 10 MG tablet Take 10 mg by mouth at bedtime.    Historical Provider, MD  glimepiride (AMARYL) 2 MG tablet Take 6 mg by mouth daily with breakfast.    Historical Provider, MD  HYDROcodone-acetaminophen (NORCO/VICODIN) 5-325 MG per tablet Take 1 tablet by mouth every 6 (six) hours as needed for moderate pain. 01/27/14   Tiffany L Reed, DO  losartan-hydrochlorothiazide (HYZAAR) 50-12.5 MG per tablet Take 1 tablet by mouth daily.    Historical Provider, MD  metFORMIN (GLUCOPHAGE) 850 MG tablet Take 500 mg by mouth 2 (two) times daily with a meal.     Historical Provider, MD  pioglitazone (ACTOS) 45 MG tablet Take 45 mg by mouth daily.    Historical Provider, MD  potassium chloride SA (K-DUR,KLOR-CON) 20 MEQ tablet Take 20 mEq by mouth daily.  Historical Provider, MD   BP 127/78 mmHg  Pulse 88  Temp(Src) 98.5 F (36.9 C) (Oral)  Resp 15  SpO2 98% Physical Exam  Constitutional: He appears well-developed.  Patient appears thin area, ribs easily palpable.  HENT:  Head: Normocephalic and atraumatic.  Right Ear: External ear normal.  Left Ear: External ear normal.  Nose: Nose normal.  Mouth/Throat: Oropharynx is clear and moist. No oropharyngeal exudate.  Eyes: Conjunctivae and EOM are normal. Pupils are equal, round, and reactive to light.  Neck: Normal range of  motion. Neck supple.  Cardiovascular: Normal rate, regular rhythm, normal heart sounds and intact distal pulses.  Exam reveals no gallop and no friction rub.   No murmur heard. Pulmonary/Chest: Effort normal and breath sounds normal. No respiratory distress. He has no wheezes.  Abdominal: Soft. Bowel sounds are normal. He exhibits no distension. There is no tenderness. There is no rebound and no guarding.  Genitourinary: Penis normal.  Musculoskeletal: Normal range of motion. He exhibits no edema or tenderness.  Neurological: He is alert.  Patient will move extremities for me on exam. One out of 5 strength in all extremities.  Skin: Skin is warm and dry.  Psychiatric: He has a normal mood and affect. His behavior is normal.  Nursing note and vitals reviewed.   ED Course  Procedures (including critical care time) Labs Review Labs Reviewed  CBC WITH DIFFERENTIAL/PLATELET - Abnormal; Notable for the following:    RDW 17.6 (*)    Platelets 105 (*)    All other components within normal limits  COMPREHENSIVE METABOLIC PANEL - Abnormal; Notable for the following:    Sodium 150 (*)    Potassium 5.5 (*)    Chloride 115 (*)    CO2 18 (*)    Glucose, Bld 177 (*)    BUN 76 (*)    Creatinine, Ser 3.13 (*)    AST 43 (*)    Alkaline Phosphatase 162 (*)    GFR calc non Af Amer 17 (*)    GFR calc Af Amer 20 (*)    Anion gap 17 (*)    All other components within normal limits  URINALYSIS, ROUTINE W REFLEX MICROSCOPIC - Abnormal; Notable for the following:    Color, Urine RED (*)    APPearance TURBID (*)    Hgb urine dipstick LARGE (*)    Bilirubin Urine LARGE (*)    Ketones, ur 15 (*)    Protein, ur >300 (*)    Leukocytes, UA MODERATE (*)    All other components within normal limits  TROPONIN I - Abnormal; Notable for the following:    Troponin I 0.05 (*)    All other components within normal limits  TROPONIN I - Abnormal; Notable for the following:    Troponin I 0.06 (*)    All other  components within normal limits  TROPONIN I - Abnormal; Notable for the following:    Troponin I 0.06 (*)    All other components within normal limits  TROPONIN I - Abnormal; Notable for the following:    Troponin I 0.07 (*)    All other components within normal limits  BASIC METABOLIC PANEL - Abnormal; Notable for the following:    Sodium 153 (*)    Chloride 119 (*)    CO2 20 (*)    Glucose, Bld 119 (*)    BUN 78 (*)    Creatinine, Ser 2.86 (*)    Calcium 8.5 (*)    GFR calc  non Af Amer 19 (*)    GFR calc Af Amer 23 (*)    All other components within normal limits  CBC - Abnormal; Notable for the following:    WBC 3.9 (*)    RDW 17.4 (*)    Platelets 115 (*)    All other components within normal limits  CREATININE, SERUM - Abnormal; Notable for the following:    Creatinine, Ser 3.18 (*)    GFR calc non Af Amer 17 (*)    GFR calc Af Amer 20 (*)    All other components within normal limits  GLUCOSE, CAPILLARY - Abnormal; Notable for the following:    Glucose-Capillary 137 (*)    All other components within normal limits  CBC - Abnormal; Notable for the following:    RDW 17.5 (*)    Platelets 106 (*)    All other components within normal limits  COMPREHENSIVE METABOLIC PANEL - Abnormal; Notable for the following:    Sodium 155 (*)    Chloride 120 (*)    CO2 19 (*)    Glucose, Bld 140 (*)    BUN 80 (*)    Creatinine, Ser 2.91 (*)    Calcium 8.6 (*)    Albumin 3.4 (*)    AST 45 (*)    Alkaline Phosphatase 166 (*)    GFR calc non Af Amer 19 (*)    GFR calc Af Amer 22 (*)    Anion gap 16 (*)    All other components within normal limits  URINE MICROSCOPIC-ADD ON - Abnormal; Notable for the following:    Bacteria, UA MANY (*)    All other components within normal limits  GLUCOSE, CAPILLARY - Abnormal; Notable for the following:    Glucose-Capillary 121 (*)    All other components within normal limits  GLUCOSE, CAPILLARY - Abnormal; Notable for the following:     Glucose-Capillary 144 (*)    All other components within normal limits  GLUCOSE, CAPILLARY - Abnormal; Notable for the following:    Glucose-Capillary 193 (*)    All other components within normal limits  CLOSTRIDIUM DIFFICILE BY PCR  URINE CULTURE  MAGNESIUM  HEMOGLOBIN A1C    Imaging Review Dg Chest 2 View  Sep 06, 2015   WEAKNESS, ALTERED MENTAL STATUS.: WEAKNESS, ALTERED MENTAL STATUS. EXAM:  CHEST  2 VIEW  COMPARISON:  None.  FINDINGS: The heart size and mediastinal contours are within normal limits. Both lungs are clear. The visualized skeletal structures are unremarkable.  IMPRESSION: No active cardiopulmonary disease.   Electronically Signed   By: Charlett NoseKevin  Dover M.D.   On: 0Dec 25, 2016 12:41   Ct Head Wo Contrast  Sep 06, 2015   CLINICAL DATA:  81 year old with failure to thrive. Patient has not eaten or taken medications for approximately 1 week. Acute mental status changes as patient is now nonverbal.  EXAM: CT HEAD WITHOUT CONTRAST  TECHNIQUE: Contiguous axial images were obtained from the base of the skull through the vertex without intravenous contrast.  COMPARISON:  MRI brain 01/23/2013.  FINDINGS: Severe cortical and deep atrophy and mild cerebellar atrophy, unchanged. Severe changes of small vessel disease of the white matter diffusely, unchanged. Old lacunar strokes in the basal ganglia bilaterally and in the deep white matter of the posterior frontal lobes bilaterally, unchanged. No mass lesion. No midline shift. No acute hemorrhage or hematoma. No extra-axial fluid collections. No evidence of acute infarction.  No skull fracture or other focal osseous abnormality involving the skull. Visualized paranasal sinuses, bilateral  mastoid air cells and bilateral middle ear cavities well-aerated. Severe bilateral carotid siphon and vertebrobasilar atherosclerosis.  IMPRESSION: 1. No acute intracranial abnormality. 2. Stable severe cortical and deep atrophy, mild cerebellar atrophy, severe chronic  microvascular ischemic changes of the white matter, and multiple bilateral lacunar strokes since May, 2014.   Electronically Signed   By: Hulan Saas M.D.   On: 01/13/2015 12:33   Dg Abd 2 Views  01/18/2015   CLINICAL DATA:  Failure to thrive. Patient has not eaten or taken is medications for approximately 1 week. Acute mental status changes as the patient is nonverbal.  EXAM: ABDOMEN - 2 VIEW  COMPARISON:  CT abdomen and pelvis 01/29/2014.  FINDINGS: Gas within several upper normal caliber loops of small bowel in the upper abdomen. Gas and liquid stool throughout upper normal caliber transverse colon, with air-fluid levels on the decubitus image. No free intraperitoneal air. Numerous pelvic phleboliths. No visible opaque urinary tract calculi. Surgical anastomotic suture material in the right mid abdomen. Degenerative changes involving the lumbar spine, the sacroiliac joints, and the hips.  IMPRESSION: No acute abdominal abnormality.   Electronically Signed   By: Hulan Saas M.D.   On: 01/18/2015 12:39     EKG Interpretation   Date/Time:  Sunday Feb 01 2015 11:38:25 EDT Ventricular Rate:  86 PR Interval:  271 QRS Duration: 85 QT Interval:  431 QTC Calculation: 516 R Axis:   71 Text Interpretation:  Sinus rhythm Prolonged PR interval LVH with  secondary repolarization abnormality Prolonged QT interval Baseline wander  in lead(s) V1 V3 Otherwise no significant change Confirmed by Arseniy Toomey   MD, Novali Vollman ([id number]) on 02/06/2015 11:50:50 AM     CRITICAL CARE Performed by: Purvis Sheffield, S Total critical care time: 30 min Critical care time was exclusive of separately billable procedures and treating other patients. Critical care was necessary to treat or prevent imminent or life-threatening deterioration. Critical care was time spent personally by me on the following activities: development of treatment plan with patient and/or surrogate as well as nursing, discussions with  consultants, evaluation of patient's response to treatment, examination of patient, obtaining history from patient or surrogate, ordering and performing treatments and interventions, ordering and review of laboratory studies, ordering and review of radiographic studies, pulse oximetry and re-evaluation of patient's condition.  MDM   Final diagnoses:  Altered mental status  Renal failure  Hyperkalemia  Hypernatremia  Dehydration  Elevated troponin    11:31 AM 81 y.o. male w hx of CVA, MI, dementia who presents from [place] living with decreased oral intake for the last week. Patient reportedly not taking his medications as well. Reportedly nonverbal at baseline. He appears to be following very basic commands on exam. Will move extremities for me on exam. Vital signs unremarkable here. We'll get screening labs and imaging.  11:34 AM I attempted to call his facility but there was no answer. Will try to call back later.  Pt w/ renal failure, newly elevated troponin, hyperkalemia. Will tx hyperkalemia w/ amp of bicarb and Cagluc. No hyperacute t waves. Will admit to hospitalist.   Purvis Sheffield, MD 02/02/15 [phone]

## 2015-02-01 NOTE — ED Notes (Signed)
Family at bedside. 

## 2015-02-01 NOTE — ED Notes (Addendum)
In and Out Cath was unsuccessful.   Blood was in the tube as the tube came out. No urine return.  Nurse was notified.

## 2015-02-01 NOTE — ED Notes (Signed)
[facility]. Patient has not eaten in 1 week. Hasn't taken any meds in over 1 week. Nonverbal. CBG 152.

## 2015-02-01 NOTE — H&P (Addendum)
PCP:   Lillia Mountain, MD   Chief Complaint:  Failure to thrive  HPI:  81 year old male who  has a past medical history of Gout; Stroke; Dementia; and Myocardial infarct. Today was brought to the hospital from skilled facility due to poor by mouth intake and lethargy for past 1 week. Patient has a history of dementia and is currently residing at the skilled facility. As per wife patient was able to feed himself until a month ago when he had pain in the right arm after that he stopped eating. Over the past 1 week patient has had very poor by mouth intake, patient is talking and answering few questions after he received one bolus of IV fluids in the ED. He denies any pain at this time no shortness of breath. There is no history of nausea vomiting but as per wife patient did have loose bowel movements over the past few days. In the ED patient found to be dehydrated with acute kidney injury creatinine 3.13 with BUN 76. Sodium 150.   Allergies:  No Known Allergies    Past Medical History  Diagnosis Date  . Gout   . Stroke   . Dementia   . Myocardial infarct     Past Surgical History  Procedure Laterality Date  . Back surgery      Prior to Admission medications   Medication Sig Start Date End Date Taking? Authorizing Provider  acetaminophen (TYLENOL) 500 MG tablet Take 1,000 mg by mouth every morning.   Yes Historical Provider, MD  aspirin EC 81 MG tablet Take 81 mg by mouth every morning.    Yes Historical Provider, MD  atorvastatin (LIPITOR) 10 MG tablet Take 10 mg by mouth every evening.    Yes Historical Provider, MD  carvedilol (COREG) 25 MG tablet Take 1 tablet (25 mg total) by mouth 2 (two) times daily with a meal. 05/31/13  Yes Kirby Funk, MD  cholecalciferol (VITAMIN D) 1000 UNITS tablet Take 2,000 Units by mouth every morning.   Yes Historical Provider, MD  clopidogrel (PLAVIX) 75 MG tablet Take 75 mg by mouth every morning.    Yes Historical Provider, MD  colchicine  0.6 MG tablet Take 0.6 mg by mouth 2 (two) times daily.   Yes Historical Provider, MD  diltiazem (CARDIZEM CD) 180 MG 24 hr capsule Take 1 capsule (180 mg total) by mouth daily. 05/31/13  Yes Kirby Funk, MD  donepezil (ARICEPT) 10 MG tablet Take 10 mg by mouth at bedtime.   Yes Historical Provider, MD  ferrous sulfate 325 (65 FE) MG tablet Take 325 mg by mouth 2 (two) times daily.   Yes Historical Provider, MD  glimepiride (AMARYL) 2 MG tablet Take 6 mg by mouth daily with breakfast.   Yes Historical Provider, MD  hydrALAZINE (APRESOLINE) 10 MG tablet Take 15 mg by mouth 3 (three) times daily.   Yes Historical Provider, MD  HYDROcodone-acetaminophen (NORCO/VICODIN) 5-325 MG per tablet Take 1 tablet by mouth every 6 (six) hours as needed for moderate pain. 01/27/14  Yes Tiffany L Reed, DO  insulin aspart (NOVOLOG) 100 UNIT/ML injection Inject 5 Units into the skin 3 (three) times daily before meals.   Yes Historical Provider, MD  losartan-hydrochlorothiazide (HYZAAR) 50-12.5 MG per tablet Take 1 tablet by mouth daily.   Yes Historical Provider, MD  metFORMIN (GLUCOPHAGE) 500 MG tablet Take 500 mg by mouth 2 (two) times daily with a meal.   Yes Historical Provider, MD  pioglitazone (ACTOS) 15 MG  tablet Take 15 mg by mouth every morning.   Yes Historical Provider, MD  potassium chloride SA (K-DUR,KLOR-CON) 20 MEQ tablet Take 20 mEq by mouth 2 (two) times daily.    Yes Historical Provider, MD  ranitidine (ZANTAC) 150 MG tablet Take 150 mg by mouth every morning.   Yes Historical Provider, MD    Social History:  reports that he quit smoking about 32 years ago. He does not have any smokeless tobacco history on file. He reports that he does not drink alcohol or use illicit drugs.  Family history- patient's mother had ovarian cancer                          Patient's father suffered from diabetes mellitus    Review of Systems:  As in the history of present illness, rest of the review of systems  unobtainable at this time due to the patient's altered mental status   Physical Exam: Blood pressure 127/78, pulse 88, temperature 98.5 F (36.9 C), temperature source Oral, resp. rate 15, SpO2 98 %. Constitutional:   Patient is a malnourished appearing male* in no acute distress and cooperative with exam. Head: Normocephalic and atraumatic Mouth: Mucus membranes moist Eyes: PERRL, EOMI, conjunctivae normal Neck: Supple, No Thyromegaly Cardiovascular: RRR, S1 normal, S2 normal Pulmonary/Chest: CTAB, no wheezes, rales, or rhonchi Abdominal: Soft. Non-tender, non-distended, bowel sounds are normal, no masses, organomegaly, or guarding present.  Neurological: Somnolent, but arousable  Extremities : No Cyanosis, Clubbing or Edema  Labs on Admission:  Basic Metabolic Panel:  Recent Labs Lab 01/26/2015 1136  NA 150*  K 5.5*  CL 115*  CO2 18*  GLUCOSE 177*  BUN 76*  CREATININE 3.13*  CALCIUM 9.1   Liver Function Tests:  Recent Labs Lab 01/14/2015 1136  AST 43*  ALT 37  ALKPHOS 162*  BILITOT 0.8  PROT 7.3  ALBUMIN 3.7   No results for input(s): LIPASE, AMYLASE in the last 168 hours. No results for input(s): AMMONIA in the last 168 hours. CBC:  Recent Labs Lab 01/30/2015 1136  WBC 4.5  NEUTROABS 3.3  HGB 14.5  HCT 45.1  MCV 86.1  PLT 105*   Cardiac Enzymes:  Recent Labs Lab 01/11/2015 1136  TROPONINI 0.05*     Radiological Exams on Admission: Dg Chest 2 View  01/13/2015   WEAKNESS, ALTERED MENTAL STATUS.: WEAKNESS, ALTERED MENTAL STATUS. EXAM:  CHEST  2 VIEW  COMPARISON:  None.  FINDINGS: The heart size and mediastinal contours are within normal limits. Both lungs are clear. The visualized skeletal structures are unremarkable.  IMPRESSION: No active cardiopulmonary disease.   Electronically Signed   By: Charlett Nose M.D.   On: 01/21/2015 12:41   Ct Head Wo Contrast  02/02/2015   CLINICAL DATA:  81 year old with failure to thrive. Patient has not eaten or taken  medications for approximately 1 week. Acute mental status changes as patient is now nonverbal.  EXAM: CT HEAD WITHOUT CONTRAST  TECHNIQUE: Contiguous axial images were obtained from the base of the skull through the vertex without intravenous contrast.  COMPARISON:  MRI brain 01/23/2013.  FINDINGS: Severe cortical and deep atrophy and mild cerebellar atrophy, unchanged. Severe changes of small vessel disease of the white matter diffusely, unchanged. Old lacunar strokes in the basal ganglia bilaterally and in the deep white matter of the posterior frontal lobes bilaterally, unchanged. No mass lesion. No midline shift. No acute hemorrhage or hematoma. No extra-axial fluid collections. No evidence  of acute infarction.  No skull fracture or other focal osseous abnormality involving the skull. Visualized paranasal sinuses, bilateral mastoid air cells and bilateral middle ear cavities well-aerated. Severe bilateral carotid siphon and vertebrobasilar atherosclerosis.  IMPRESSION: 1. No acute intracranial abnormality. 2. Stable severe cortical and deep atrophy, mild cerebellar atrophy, severe chronic microvascular ischemic changes of the white matter, and multiple bilateral lacunar strokes since May, 2014.   Electronically Signed   By: Hulan Saashomas  Lawrence M.D.   On: 10/03/2014 12:33   Dg Abd 2 Views  10-28-2014   CLINICAL DATA:  Failure to thrive. Patient has not eaten or taken is medications for approximately 1 week. Acute mental status changes as the patient is nonverbal.  EXAM: ABDOMEN - 2 VIEW  COMPARISON:  CT abdomen and pelvis 01/29/2014.  FINDINGS: Gas within several upper normal caliber loops of small bowel in the upper abdomen. Gas and liquid stool throughout upper normal caliber transverse colon, with air-fluid levels on the decubitus image. No free intraperitoneal air. Numerous pelvic phleboliths. No visible opaque urinary tract calculi. Surgical anastomotic suture material in the right mid abdomen. Degenerative  changes involving the lumbar spine, the sacroiliac joints, and the hips.  IMPRESSION: No acute abdominal abnormality.   Electronically Signed   By: Hulan Saashomas  Lawrence M.D.   On: 10/03/2014 12:39    EKG: Independently reviewed. Sinus rhythm, prolonged QT interval   Assessment/Plan Active Problems:   Dementia   Essential hypertension, benign   Coronary atherosclerosis of native coronary artery   Diabetes   Renal failure   AKI (acute kidney injury)  Acute kidney injury Likely from poor by mouth intake, will continue IV fluids with normal saline at 100 mL per hour. Patient has responded to IV fluid bolus the ED. The blood pressure is stable. Follow BMP in a.m.  Hypernatremia From poor by mouth intake as above, will need to hydrate with normal saline first because of dehydration. Will repeat labs at 8 PM tonight. If sodium still elevated will have to change the fluids to half-normal saline.  Failure to thrive/ protein calorie malnutrition Patient not eating and drinking well over the past 1 week, will get nutrition consult to assess the nutritional needs.  Diabetes mellitus Hold oral hypoglycemics, start sliding scale insulin with NovoLog.  CAD Continue Coreg, patient does have mild elevation of troponin 0.05. Likely from the acute kidney injury. We will follow serial troponin. EKG shows sinus rhythm and prolonged QT interval.  Prolonged QT interval Likely from electric imbalances and dehydration. Will check serum magnesium  Atrial fibrillation HR is controlled, will hold the Cardizem at this time due to dehydration.  Hyperkalemia Potassium is 5.5, one dose of calcium gluconate given in the ED. Patient started on normal saline, will recheck BMP at 8 PM. I think potassium will come down with IV hydration. If still elevated might need Kayexalate.  DVT prophylaxis Lovenox   Code status: DNR   Family discussion: Admission, patients condition and plan of care including tests being  ordered have been discussed with the patient and *his wife at bedside* who indicate understanding and agree with the plan and Code Status.   Time Spent on Admission: 60 min  Tandy Grawe S [facility] Pager: [phone] 10-28-2014, 1:49 PM  If 7PM-7AM, please contact night-coverage  [email or web address]  Password [email or web address]

## 2015-02-01 NOTE — Progress Notes (Signed)
Pt has only had about 20cc of red urine since arriving to the floor. Pt receiving IV fluids. UA sent per MD orders. Pt has a condom cath on to help monitor I&O. Will continue to monitor urine output. Per ED report pt was bladder scanned and only found to have 130cc in bladder at that time.   Arta BruceDeutsch, Abrahan Fulmore [facility] 01/14/2015 6:44 PM

## 2015-02-01 NOTE — ED Notes (Signed)
Bed: [id number] Expected date:  Expected time:  Means of arrival:  Comments: Ems ftt

## 2015-02-02 ENCOUNTER — Other Ambulatory Visit: Payer: Self-pay | Admitting: *Deleted

## 2015-02-02 DIAGNOSIS — E870 Hyperosmolality and hypernatremia: Secondary | ICD-10-CM

## 2015-02-02 DIAGNOSIS — N390 Urinary tract infection, site not specified: Secondary | ICD-10-CM

## 2015-02-02 LAB — CBC
HCT: 44.3 % (ref 39.0–52.0)
Hemoglobin: 14.3 g/dL (ref 13.0–17.0)
MCH: 28 pg (ref 26.0–34.0)
MCHC: 32.3 g/dL (ref 30.0–36.0)
MCV: 86.9 fL (ref 78.0–100.0)
Platelets: 106 10*3/uL — ABNORMAL LOW (ref 150–400)
RBC: 5.1 MIL/uL (ref 4.22–5.81)
RDW: 17.5 % — AB (ref 11.5–15.5)
WBC: 4.3 10*3/uL (ref 4.0–10.5)

## 2015-02-02 LAB — COMPREHENSIVE METABOLIC PANEL
ALK PHOS: 166 U/L — AB (ref 38–126)
ALT: 37 U/L (ref 17–63)
AST: 45 U/L — ABNORMAL HIGH (ref 15–41)
Albumin: 3.4 g/dL — ABNORMAL LOW (ref 3.5–5.0)
Anion gap: 16 — ABNORMAL HIGH (ref 5–15)
BUN: 80 mg/dL — ABNORMAL HIGH (ref 6–20)
CALCIUM: 8.6 mg/dL — AB (ref 8.9–10.3)
CO2: 19 mmol/L — ABNORMAL LOW (ref 22–32)
Chloride: 120 mmol/L — ABNORMAL HIGH (ref 101–111)
Creatinine, Ser: 2.91 mg/dL — ABNORMAL HIGH (ref 0.61–1.24)
GFR calc non Af Amer: 19 mL/min — ABNORMAL LOW (ref >60)
GFR, EST AFRICAN AMERICAN: 22 mL/min — AB (ref >60)
GLUCOSE: 140 mg/dL — AB (ref 65–99)
Potassium: 4.9 mmol/L (ref 3.5–5.1)
SODIUM: 155 mmol/L — AB (ref 135–145)
Total Bilirubin: 0.6 mg/dL (ref 0.3–1.2)
Total Protein: 7 g/dL (ref 6.5–8.1)

## 2015-02-02 LAB — CLOSTRIDIUM DIFFICILE BY PCR: CDIFFPCR: NEGATIVE

## 2015-02-02 LAB — GLUCOSE, CAPILLARY
Glucose-Capillary: 144 mg/dL — ABNORMAL HIGH (ref 65–99)
Glucose-Capillary: 193 mg/dL — ABNORMAL HIGH (ref 65–99)
Glucose-Capillary: 195 mg/dL — ABNORMAL HIGH (ref 65–99)
Glucose-Capillary: 195 mg/dL — ABNORMAL HIGH (ref 65–99)

## 2015-02-02 LAB — TROPONIN I: Troponin I: 0.07 ng/mL — ABNORMAL HIGH (ref <0.031)

## 2015-02-02 MED ORDER — METOPROLOL TARTRATE 1 MG/ML IV SOLN
5.0000 mg | Freq: Four times a day (QID) | INTRAVENOUS | Status: DC
  Administered 2015-02-02 – 2015-02-03 (×4): 5 mg via INTRAVENOUS
  Filled 2015-02-02 (×3): qty 5

## 2015-02-02 MED ORDER — DEXTROSE-NACL 5-0.45 % IV SOLN
INTRAVENOUS | Status: AC
  Administered 2015-02-02 (×2): 100 mL/h via INTRAVENOUS

## 2015-02-02 MED ORDER — HYDROCODONE-ACETAMINOPHEN 5-325 MG PO TABS: ORAL_TABLET | ORAL | Status: AC

## 2015-02-02 MED ORDER — CEFTRIAXONE SODIUM IN DEXTROSE 20 MG/ML IV SOLN
1.0000 g | INTRAVENOUS | Status: DC
  Administered 2015-02-02: 1 g via INTRAVENOUS
  Filled 2015-02-02 (×2): qty 50

## 2015-02-02 NOTE — Progress Notes (Signed)
Unable to give patient's medicine. Patient is not swallowing when medicine was placed in his mouth.

## 2015-02-02 NOTE — Progress Notes (Addendum)
Initial Nutrition Assessment  DOCUMENTATION CODES:  Non-severe (moderate) malnutrition in context of acute illness/injury  INTERVENTION: - Will monitor for ability to advance diet and will order Ensure Enlive and Magic Cup at that time - RD to continue to monitor  NUTRITION DIAGNOSIS:  Inadequate oral intake related to inability to eat as evidenced by NPO status, per patient/family report.  GOAL:  Patient will meet greater than or equal to 90% of their needs  MONITOR:  Diet advancement, Weight trends, Labs, I & O's  REASON FOR ASSESSMENT:  Consult Assessment of nutrition requirement/status  Malnutrition Screening Tool  ASSESSMENT: Per H&P, 81 year old male who  has a past medical history of gout, stroke, dementia, and myocardial infarct. Today was brought to the hospital from skilled facility due to poor by mouth intake and lethargy for past 1 week. Patient has a history of dementia and is currently residing at the skilled facility. As per wife patient was able to feed himself until a month ago when he had pain in the right arm after that he stopped eating. Over the past 1 week patient has had very poor by mouth intake.  Pt seen for consult for assessment and MST. BMI indicates borderline underweight and normal weight status. Pt sleeping at time of visit and per rounds this AM, pt very demented. All information provided by wife.  PTA, pt had a very poor appetite x1 week. She states his chewing ability is diminished and she feels he will need pureed diet with diet advancement. At [facility] he was drinking nutrition supplement and receiving Magic Cup and she is interested in him having supplements here as soon as he is able. She states he has lost ~20 lbs recently due to lack of intakes.  Unable to perform physical assessment at this time due to RN needing to attend to pt. Not able to meet needs. Medications reviewed. Labs; CBGs: 121-193 mg/dL, Na: 155 mmol/L and Cl: 120 mmol/L  indicating dehydration, BUN/creatinine elevated, AST/Alk phos elevated, GFR; 22.  Height:  Ht Readings from Last 1 Encounters:  02/02/15 _0  (1.676 m)    Weight:  Wt Readings from Last 1 Encounters:  02/02/15 115 lb 15.4 oz (52.6 kg)    Ideal Body Weight:  64.5 kg (kg)  Wt Readings from Last 10 Encounters:  02/02/15 115 lb 15.4 oz (52.6 kg)  06/26/14 143 lb (64.864 kg)  06/23/14 143 lb (64.864 kg)  05/29/14 137 lb (62.143 kg)  04/29/14 140 lb (63.504 kg)  03/25/14 139 lb (63.05 kg)  02/19/14 144 lb (65.318 kg)  01/27/14 143 lb (64.864 kg)  05/31/13 145 lb 8 oz (65.998 kg)    BMI:  Body mass index is 18.73 kg/(m^2).  Estimated Nutritional Needs:  Kcal:  1500-1700  Protein:  60-75 grams  Fluid:  >2.5L/day  Skin:  Reviewed, no issues  Diet Order:  NPO  EDUCATION NEEDS:  No education needs identified at this time   Intake/Output Summary (Last 24 hours) at 02/02/15 1348 Last data filed at 02/02/15 0700  Gross per 24 hour  Intake   1505 ml  Output    562 ml  Net    943 ml    Last BM:  5/23   Jarome Matin, RD, LDN Inpatient Clinical Dietitian Pager # [phone] After hours/weekend pager # [phone]   ADDENDUM: Per chart review, pt has lost 28 lbs (19.5% body weight) in 7 months which is significant for time frame.

## 2015-02-02 NOTE — Telephone Encounter (Signed)
[id number] 

## 2015-02-02 NOTE — Progress Notes (Addendum)
[facility] PROGRESS NOTE  Darren Underwood ZOX:[id number] DOB: 05/16/1933 DOA: 02/09/2015 PCP: Lillia Mountain, MD Brief narrative 81 year old male with history of stroke, dementia and history of MI resident of skilled nursing facility was brought to the ED with past 1-2 weeks of poor by mouth intake, generalized weakness and not participating with therapy. At baseline until one month ago patient was able to feed himself and participating with therapy. However he started having pain in his eye time after which he stopped feeding himself. Patient was found to be severely dehydrated on admission with acute kidney injury, hypernatremia and UTI.   Assessment/Plan: Acute kidney injury Secondary to severe dehydration. Started on IV normal saline. Patient has progressive hypernatremia so will change fluid to D5 half NS. Monitor strict I/O and daily renal function.  Hypernatremia Secondary to dehydration. Switched fluid to D5 half normal saline.  Acute encephalopathy Secondary to severe dehydration and UTI. Follow urine culture. Started on Rocephin. CT head on admission unremarkable.  Failure to thrive Secondary to underlying dementia, deconditioning , UTI and dehydration. Physical therapy evaluation once more alert and awake.  Dementia Moderate to severe On aricept at home.  Elevated troponin Possibly event ischemia with underlying infection and dehydration. Holding all by mouth medications. Will place on when necessary IV Lopressor. Has history of MI. EKG shows ST depression in lateral leads. Troponin peaked at 0.07. Will hold off on further intervention.  History of stroke Holding Plavix as patient nothing by mouth.  DVT prophylaxis: SCD  Diet: Nothing by mouth   Code Status:DNR Family Communication: Discussed at length at bedside with patient's wife and daughter. Patients condition on improve over the next 48 hours will plan on palliative care evaluation. Family agrees on  it. Disposition Plan: Currently inpatient. Return to skilled nursing if clinically improved over next 48-72 hours.   Consultants:  None  Procedures:  Head CT  Antibiotics:  Rocephin 5/23--  HPI/Subjective: Patient seen and examined. His lethargic and very confused. Unable to comprehend  Objective: Filed Vitals:   02/02/15 0947  BP: 164/79  Pulse: 94  Temp: 98.2 F (36.8 C)  Resp: 18    Intake/Output Summary (Last 24 hours) at 02/02/15 1415 Last data filed at 02/02/15 0700  Gross per 24 hour  Intake   1505 ml  Output    562 ml  Net    943 ml   Filed Weights   2015-02-09 1511 02/02/15 1347  Weight: 52.6 kg (115 lb 15.4 oz) 52.6 kg (115 lb 15.4 oz)    Exam:   General: Elderly male lying in bed appears frail , eye opening with commands but nonverbal  HEENT: Pallor present, dry oral mucosa, temporal wasting, supple neck  Cardiovascular: Normal S1 and S2, no murmurs rub or gallop  Respiratory: Clear to auscultation bilaterally, no added sounds  GI: Soft, nondistended, nontender, chronic catheter in place, bowel sounds present  Musculoskeletal: Warm, no edema  CNS: Lethargic, oriented x 0, monos extremities with painful stimuli.  Data Reviewed: Basic Metabolic Panel:  Recent Labs Lab 09-Feb-2015 1136 2015/02/09 1550 Feb 09, 2015 2012 02/02/15 0140  NA 150*  --  153* 155*  K 5.5*  --  4.7 4.9  CL 115*  --  119* 120*  CO2 18*  --  20* 19*  GLUCOSE 177*  --  119* 140*  BUN 76*  --  78* 80*  CREATININE 3.13* 3.18* 2.86* 2.91*  CALCIUM 9.1  --  8.5* 8.6*  MG  --   --  2.0  --    Liver Function Tests:  Recent Labs Lab 02/07/2015 1136 02/02/15 0140  AST 43* 45*  ALT 37 37  ALKPHOS 162* 166*  BILITOT 0.8 0.6  PROT 7.3 7.0  ALBUMIN 3.7 3.4*   No results for input(s): LIPASE, AMYLASE in the last 168 hours. No results for input(s): AMMONIA in the last 168 hours. CBC:  Recent Labs Lab 01/11/2015 1136 02/04/2015 1550 02/02/15 0140  WBC 4.5 3.9* 4.3   NEUTROABS 3.3  --   --   HGB 14.5 13.8 14.3  HCT 45.1 42.2 44.3  MCV 86.1 85.8 86.9  PLT 105* 115* 106*   Cardiac Enzymes:  Recent Labs Lab 01/24/2015 1136 01/26/2015 1550 01/15/2015 2012 02/02/15 0140  TROPONINI 0.05* 0.06* 0.06* 0.07*   BNP (last 3 results) No results for input(s): BNP in the last 8760 hours.  ProBNP (last 3 results) No results for input(s): PROBNP in the last 8760 hours.  CBG:  Recent Labs Lab 01/23/2015 1636 01/27/2015 2241 02/02/15 0741 02/02/15 1212  GLUCAP 137* 121* 144* 193*    Recent Results (from the past 240 hour(s))  Clostridium Difficile by PCR     Status: None   Collection Time: 02/02/15 10:13 AM  Result Value Ref Range Status   C difficile by pcr NEGATIVE NEGATIVE Final     Studies: Dg Chest 2 View  01/30/2015   WEAKNESS, ALTERED MENTAL STATUS.: WEAKNESS, ALTERED MENTAL STATUS. EXAM:  CHEST  2 VIEW  COMPARISON:  None.  FINDINGS: The heart size and mediastinal contours are within normal limits. Both lungs are clear. The visualized skeletal structures are unremarkable.  IMPRESSION: No active cardiopulmonary disease.   Electronically Signed   By: Charlett NoseKevin  Dover M.D.   On: 01/12/2015 12:41   Ct Head Wo Contrast  01/13/2015   CLINICAL DATA:  81 year old with failure to thrive. Patient has not eaten or taken medications for approximately 1 week. Acute mental status changes as patient is now nonverbal.  EXAM: CT HEAD WITHOUT CONTRAST  TECHNIQUE: Contiguous axial images were obtained from the base of the skull through the vertex without intravenous contrast.  COMPARISON:  MRI brain 01/23/2013.  FINDINGS: Severe cortical and deep atrophy and mild cerebellar atrophy, unchanged. Severe changes of small vessel disease of the white matter diffusely, unchanged. Old lacunar strokes in the basal ganglia bilaterally and in the deep white matter of the posterior frontal lobes bilaterally, unchanged. No mass lesion. No midline shift. No acute hemorrhage or hematoma. No  extra-axial fluid collections. No evidence of acute infarction.  No skull fracture or other focal osseous abnormality involving the skull. Visualized paranasal sinuses, bilateral mastoid air cells and bilateral middle ear cavities well-aerated. Severe bilateral carotid siphon and vertebrobasilar atherosclerosis.  IMPRESSION: 1. No acute intracranial abnormality. 2. Stable severe cortical and deep atrophy, mild cerebellar atrophy, severe chronic microvascular ischemic changes of the white matter, and multiple bilateral lacunar strokes since May, 2014.   Electronically Signed   By: Hulan Saashomas  Lawrence M.D.   On: 01/29/2015 12:33   Dg Abd 2 Views  01/31/2015   CLINICAL DATA:  Failure to thrive. Patient has not eaten or taken is medications for approximately 1 week. Acute mental status changes as the patient is nonverbal.  EXAM: ABDOMEN - 2 VIEW  COMPARISON:  CT abdomen and pelvis 01/29/2014.  FINDINGS: Gas within several upper normal caliber loops of small bowel in the upper abdomen. Gas and liquid stool throughout upper normal caliber transverse colon, with air-fluid levels on the  decubitus image. No free intraperitoneal air. Numerous pelvic phleboliths. No visible opaque urinary tract calculi. Surgical anastomotic suture material in the right mid abdomen. Degenerative changes involving the lumbar spine, the sacroiliac joints, and the hips.  IMPRESSION: No acute abdominal abnormality.   Electronically Signed   By: Hulan Saas M.D.   On: 2015/02/13 12:39    Scheduled Meds: . acetaminophen  1,000 mg Oral q morning - 10a  . antiseptic oral rinse  7 mL Mouth Rinse q12n4p  . carvedilol  25 mg Oral BID WC  . cefTRIAXone (ROCEPHIN)  IV  1 g Intravenous Q24H  . chlorhexidine  15 mL Mouth Rinse BID  . clopidogrel  75 mg Oral q morning - 10a  . colchicine  0.6 mg Oral BID  . donepezil  10 mg Oral QHS  . insulin aspart  0-9 Units Subcutaneous TID WC  . metoprolol  5 mg Intravenous 4 times per day   Continuous  Infusions: . dextrose 5 % and 0.45% NaCl 100 mL/hr (02/02/15 0753)      Time spent: 25 minutes    Courteney Alderete  [facility] Pager [phone] If 7PM-7AM, please contact night-coverage at [email or web address], password [email or web address] 02/02/2015, 2:15 PM  LOS: 1 day

## 2015-02-02 NOTE — Clinical Social Work Note (Signed)
Clinical Social Work Assessment  Patient Details  Name: Darren Underwood MRN: [id number] Date of Birth: Dec 21, 1932  Date of referral:  02/02/15               Reason for consult:  Facility Placement                Permission sought to share information with:  Facility Industrial/product designerContact Representative Permission granted to share information::  Yes, Verbal Permission Granted  Name::        Agency::     Relationship::     Contact Information:     Housing/Transportation Living arrangements for the past 2 months:  Skilled Nursing Facility Source of Information:  Spouse Patient Interpreter Needed:  None Criminal Activity/Legal Involvement Pertinent to Current Situation/Hospitalization:  No - Comment as needed Significant Relationships:  Adult Children, Spouse Lives with:  Facility Resident Do you feel safe going back to the place where you live?  Yes Need for family participation in patient care:  Yes (Comment)  Care giving concerns:  CSW received consult that patient was admitted from [facility].    Social Worker assessment / plan:  CSW confirmed with patient's wife, Darren Underwood via phone that she plans for [name] when stable. CSW also [facility] that they would be able to take patient back when ready.   Employment status:  Retired Health and safety inspectornsurance information:  Medicare PT Recommendations:    Information / Referral to community resources:  Skilled Nursing Facility  Patient/Family's Response to care:  Patient's wife informed CSW that [facility], but [facility] a few months ago and they have been very pleased with them.   Patient/Family's Understanding of and Emotional Response to Diagnosis, Current Treatment, and Prognosis:  Patient's wife informed CSW that she plans to come to the hospital around noon today to talk with the Dr re: why he was  admitted.   Emotional Assessment Appearance:  Appears stated age Attitude/Demeanor/Rapport:    Affect (typically observed):  Calm Orientation:  Oriented to Self Alcohol / Substance use:    Psych involvement (Current and /or in the community):  No (Comment)  Discharge Needs  Concerns to be addressed:    Readmission within the last 30 days:    Current discharge risk:    Barriers to Discharge:      Arlyss RepressHarrison, Dagny Fiorentino F, LCSW 02/02/2015, 10:08 AM

## 2015-02-03 DIAGNOSIS — B3749 Other urogenital candidiasis: Secondary | ICD-10-CM

## 2015-02-03 DIAGNOSIS — I249 Acute ischemic heart disease, unspecified: Secondary | ICD-10-CM

## 2015-02-03 LAB — BASIC METABOLIC PANEL
ANION GAP: 16 — AB (ref 5–15)
BUN: 91 mg/dL — AB (ref 6–20)
CALCIUM: 8.4 mg/dL — AB (ref 8.9–10.3)
CHLORIDE: 119 mmol/L — AB (ref 101–111)
CO2: 20 mmol/L — ABNORMAL LOW (ref 22–32)
Creatinine, Ser: 2.62 mg/dL — ABNORMAL HIGH (ref 0.61–1.24)
GFR calc Af Amer: 25 mL/min — ABNORMAL LOW (ref >60)
GFR, EST NON AFRICAN AMERICAN: 22 mL/min — AB (ref >60)
GLUCOSE: 268 mg/dL — AB (ref 65–99)
POTASSIUM: 4.2 mmol/L (ref 3.5–5.1)
Sodium: 155 mmol/L — ABNORMAL HIGH (ref 135–145)

## 2015-02-03 LAB — HEMOGLOBIN A1C
Hgb A1c MFr Bld: 8 % — ABNORMAL HIGH (ref 4.8–5.6)
MEAN PLASMA GLUCOSE: 183 mg/dL

## 2015-02-03 LAB — GLUCOSE, CAPILLARY: GLUCOSE-CAPILLARY: 220 mg/dL — AB (ref 65–99)

## 2015-02-04 LAB — URINE CULTURE: Colony Count: >=100000

## 2015-02-11 NOTE — Progress Notes (Signed)
   30-Jan-2015 1100  Clinical Encounter Type  Visited With Family  Visit Type Death  Referral From Nurse (Charge RN via Diplomatic Services operational officersecretary)  Spiritual Encounters  Spiritual Needs Grief support;Emotional  Stress Factors  Family Stress Factors Loss   Provided bereavement support to Mr Toulouse's wife Dorann LodgeJuanita and, later, to her daughter and son-in-law.  Dorann LodgeJuanita used opportunity to share and process about her husband's decline in the past five years, as well as about the death of one of her three daughters in December.  She notes that she has strong support from a group of girlfriends and from her church community.  Provided pastoral presence, reflective listening, and coaching in self-care and accepting support through mourning and bereavement (for example, delegating someone to share news so that they don't have to update everyone).  Family verbalized gratitude for emotional support and reminders about care.    [address] StreetChaplain Lucilla Petrenko [place], [place] [phone]

## 2015-02-11 NOTE — Progress Notes (Signed)
Pt unresponsive, not breathing, no pulse at 0914. Pronounced death with Ernst BreachMarissa Long, RN and Dr. Gonzella Lexhungel assessed and verified. Spouse called.

## 2015-02-11 NOTE — Discharge Summary (Addendum)
Physician Discharge Summary  Darren Underwood YNW:[id number] DOB: 03-07-1933 DOA: 02-04-2015  PCP: Lillia Mountain, MD  Admit date: 02-04-15 Discharge date: 02/06/2015  Time spent: 25 minutes   Discharge Diagnoses:  Active Problems:   Hypernatremia with dehydration   bradycardia   Myocardiac infarction   Dementia   Essential hypertension, benign   Coronary atherosclerosis of native coronary artery   Diabetes   Renal failure   AKI (acute kidney injury)   Discharge Condition: patient expired on 2015-02-06 at 9:14 am    Filed Weights   Feb 04, 2015 1511 02/02/15 1347 02-06-15 0529  Weight: 52.6 kg (115 lb 15.4 oz) 52.6 kg (115 lb 15.4 oz) 54.6 kg (120 lb 5.9 oz)    History of present illness:  81 year old male with history of stroke, dementia and history of MI resident of skilled nursing facility was brought to the ED with past 1-2 weeks of poor by mouth intake, generalized weakness and not participating with therapy. At baseline until one month ago patient was able to feed himself and participating with therapy. However he started having pain in his eye time after which he stopped feeding himself. Patient was found to be severely dehydrated on admission with acute kidney injury, hypernatremia and UTI.  Hospital Course:  Acute kidney injury Secondary to severe dehydration. Monitored on d51/2 NS.   Hypernatremia Secondary to dehydration. Switched fluid to D5 half normal saline.  Acute encephalopathy Secondary to severe dehydration and UTI.  Started on Rocephin. CT head on admission unremarkable.  Elevated troponin  Demand ischemia vs ACS . ST depression in lateral leads on EKG noted. Given severe deconditioning no cardiac intervention was planned  UTI cx growing klebsiella. Possibly contributing to dehydration and AKI.  Failure to thrive Secondary to underlying dementia, deconditioning , UTI and dehydration.  Dementia Moderate to severe On aricept at home.  History  of stroke   Patient mental status did not change overnight. He became bradycardic to 40s in the morning. When nurse checked in his room this morning patient ws unresponsive. He was pronounced dead at 9:14 am.     Code Status:DNR Family Communication: Discussed at length at bedside with patient's wife and daughter on 5/23 with plan on palliative care discussion or GOC if unimproved in next 24 hrs. D/w pts wife and updated pts death  on the phone on Feb 06, 2023   Consultants:  None  Procedures:  Head CT  Antibiotics:  Rocephin 5/23--     Discharge Instructions    Discharge Medication List as of 06-Feb-2015 12:17 PM    START taking these medications   Details                                                                                                           No Known Allergies    The results of significant diagnostics from this hospitalization (including imaging, microbiology, ancillary and laboratory) are listed below for reference.    Significant Diagnostic Studies: Dg Chest 2 View  Feb 04, 2015   WEAKNESS, ALTERED MENTAL STATUS.: WEAKNESS, ALTERED MENTAL STATUS. EXAM:  CHEST  2 VIEW  COMPARISON:  None.  FINDINGS: The heart size and mediastinal contours are within normal limits. Both lungs are clear. The visualized skeletal structures are unremarkable.  IMPRESSION: No active cardiopulmonary disease.   Electronically Signed   By: Charlett NoseKevin  Dover M.D.   On: 01/24/2015 12:41   Ct Head Wo Contrast  01/11/2015   CLINICAL DATA:  81 year old with failure to thrive. Patient has not eaten or taken medications for approximately 1 week. Acute mental status changes as patient is now nonverbal.  EXAM: CT HEAD WITHOUT CONTRAST  TECHNIQUE: Contiguous axial images were obtained from the base of the skull through the vertex without intravenous contrast.  COMPARISON:  MRI brain 01/23/2013.  FINDINGS: Severe cortical and deep atrophy and mild cerebellar  atrophy, unchanged. Severe changes of small vessel disease of the white matter diffusely, unchanged. Old lacunar strokes in the basal ganglia bilaterally and in the deep white matter of the posterior frontal lobes bilaterally, unchanged. No mass lesion. No midline shift. No acute hemorrhage or hematoma. No extra-axial fluid collections. No evidence of acute infarction.  No skull fracture or other focal osseous abnormality involving the skull. Visualized paranasal sinuses, bilateral mastoid air cells and bilateral middle ear cavities well-aerated. Severe bilateral carotid siphon and vertebrobasilar atherosclerosis.  IMPRESSION: 1. No acute intracranial abnormality. 2. Stable severe cortical and deep atrophy, mild cerebellar atrophy, severe chronic microvascular ischemic changes of the white matter, and multiple bilateral lacunar strokes since May, 2014.   Electronically Signed   By: Hulan Saashomas  Lawrence M.D.   On: 02/10/2015 12:33   Dg Abd 2 Views  02/09/2015   CLINICAL DATA:  Failure to thrive. Patient has not eaten or taken is medications for approximately 1 week. Acute mental status changes as the patient is nonverbal.  EXAM: ABDOMEN - 2 VIEW  COMPARISON:  CT abdomen and pelvis 01/29/2014.  FINDINGS: Gas within several upper normal caliber loops of small bowel in the upper abdomen. Gas and liquid stool throughout upper normal caliber transverse colon, with air-fluid levels on the decubitus image. No free intraperitoneal air. Numerous pelvic phleboliths. No visible opaque urinary tract calculi. Surgical anastomotic suture material in the right mid abdomen. Degenerative changes involving the lumbar spine, the sacroiliac joints, and the hips.  IMPRESSION: No acute abdominal abnormality.   Electronically Signed   By: Hulan Saashomas  Lawrence M.D.   On: 01/15/2015 12:39    Microbiology: Recent Results (from the past 240 hour(s))  Clostridium Difficile by PCR     Status: None   Collection Time: 02/02/15 10:13 AM  Result  Value Ref Range Status   C difficile by pcr NEGATIVE NEGATIVE Final  Culture, Urine     Status: None (Preliminary result)   Collection Time: 02/02/15  4:14 PM  Result Value Ref Range Status   Specimen Description URINE, CLEAN CATCH  Final   Special Requests NONE  Final   Colony Count   Final    >=100,000 COLONIES/ML Performed at [facility]    Culture   Final    GRAM NEGATIVE RODS Performed at [facility]    Report Status PENDING  Incomplete     Labs: Basic Metabolic Panel:  Recent Labs Lab 01/20/2015 1136 02/08/2015 1550 01/18/2015 2012 02/02/15 0140 June 24, 2015 0830  NA 150*  --  153* 155* 155*  K 5.5*  --  4.7 4.9 4.2  CL 115*  --  119* 120* 119*  CO2 18*  --  20* 19* 20*  GLUCOSE 177*  --  119* 140* 268*  BUN 76*  --  78* 80* 91*  CREATININE 3.13* 3.18* 2.86* 2.91* 2.62*  CALCIUM 9.1  --  8.5* 8.6* 8.4*  MG  --   --  2.0  --   --    Liver Function Tests:  Recent Labs Lab Feb 23, 2015 1136 02/02/15 0140  AST 43* 45*  ALT 37 37  ALKPHOS 162* 166*  BILITOT 0.8 0.6  PROT 7.3 7.0  ALBUMIN 3.7 3.4*   No results for input(s): LIPASE, AMYLASE in the last 168 hours. No results for input(s): AMMONIA in the last 168 hours. CBC:  Recent Labs Lab 2015/02/23 1136 02/23/15 1550 02/02/15 0140  WBC 4.5 3.9* 4.3  NEUTROABS 3.3  --   --   HGB 14.5 13.8 14.3  HCT 45.1 42.2 44.3  MCV 86.1 85.8 86.9  PLT 105* 115* 106*   Cardiac Enzymes:  Recent Labs Lab 23-Feb-2015 1136 2015-02-23 1550 2015-02-23 2012 02/02/15 0140  TROPONINI 0.05* 0.06* 0.06* 0.07*   BNP: BNP (last 3 results) No results for input(s): BNP in the last 8760 hours.  ProBNP (last 3 results) No results for input(s): PROBNP in the last 8760 hours.  CBG:  Recent Labs Lab 02/02/15 0741 02/02/15 1212 02/02/15 1709 02/02/15 2231 01/21/2015 0742  GLUCAP 144* 193* 195* 195* 220*       Signed:  Lian Pounds  [facility] 01/19/2015, 2:54 PM

## 2015-02-11 NOTE — Clinical Documentation Improvement (Signed)
Platelets as below.  Please identify any clinical conditions associated with the abnormal platelet values and document in your progress notes and carry over to the discharge summary.    Component      Platelets  Latest Ref Rng      150 - 400 K/uL  01/28/2015     11:36 AM 105 (L)  01/30/2015     3:50 PM 115 (L)  02/02/2015     1:40 AM 106 (L)   Possible Clinical Conditions: -Thrombocytopenia -Other condition (please specify) -Unable to determine  Thank you, Doy MinceVangela Madelaine Whipple, RN [phone] Clinical Documentation Specialist

## 2015-02-11 NOTE — Care Management Note (Signed)
Case Management Note  Patient Details  Name: Francene FindersHenry E Robeck MRN: [id number] Date of Birth: 1932-09-15  Subjective/Objective:  Pt Expired                  Action/Plan:   Expected Discharge Date:                  Expected Discharge Plan:  Expired  In-House Referral:     Discharge planning Services  CM Consult  Post Acute Care Choice:    Choice offered to:     DME Arranged:    DME Agency:     HH Arranged:    HH Agency:     Status of Service:     Medicare Important Message Given:    Date Medicare IM Given:    Medicare IM give by:    Date Additional Medicare IM Given:    Additional Medicare Important Message give by:     If discussed at Long Length of Stay Meetings, dates discussed:    Additional CommentsGeni Bers:  Riyan Haile, RN 01/13/2015, 11:42 AM

## 2015-02-11 DEATH — deceased

## 2015-03-05 DIAGNOSIS — F0150 Vascular dementia without behavioral disturbance: Secondary | ICD-10-CM | POA: Insufficient documentation

## 2015-03-05 DIAGNOSIS — E785 Hyperlipidemia, unspecified: Secondary | ICD-10-CM | POA: Insufficient documentation

## 2015-03-05 DIAGNOSIS — K219 Gastro-esophageal reflux disease without esophagitis: Secondary | ICD-10-CM | POA: Insufficient documentation

## 2015-03-05 DIAGNOSIS — M1A9XX0 Chronic gout, unspecified, without tophus (tophi): Secondary | ICD-10-CM | POA: Insufficient documentation

## 2017-02-08 IMAGING — CR DG CHEST 2V
2 series · 2 of 2 positions shown · non-contrast
Comparison: None.

WEAKNESS, ALTERED MENTAL STATUS.:
WEAKNESS, ALTERED MENTAL STATUS.
EXAM:

CHEST  2 VIEW

[x chest ap]
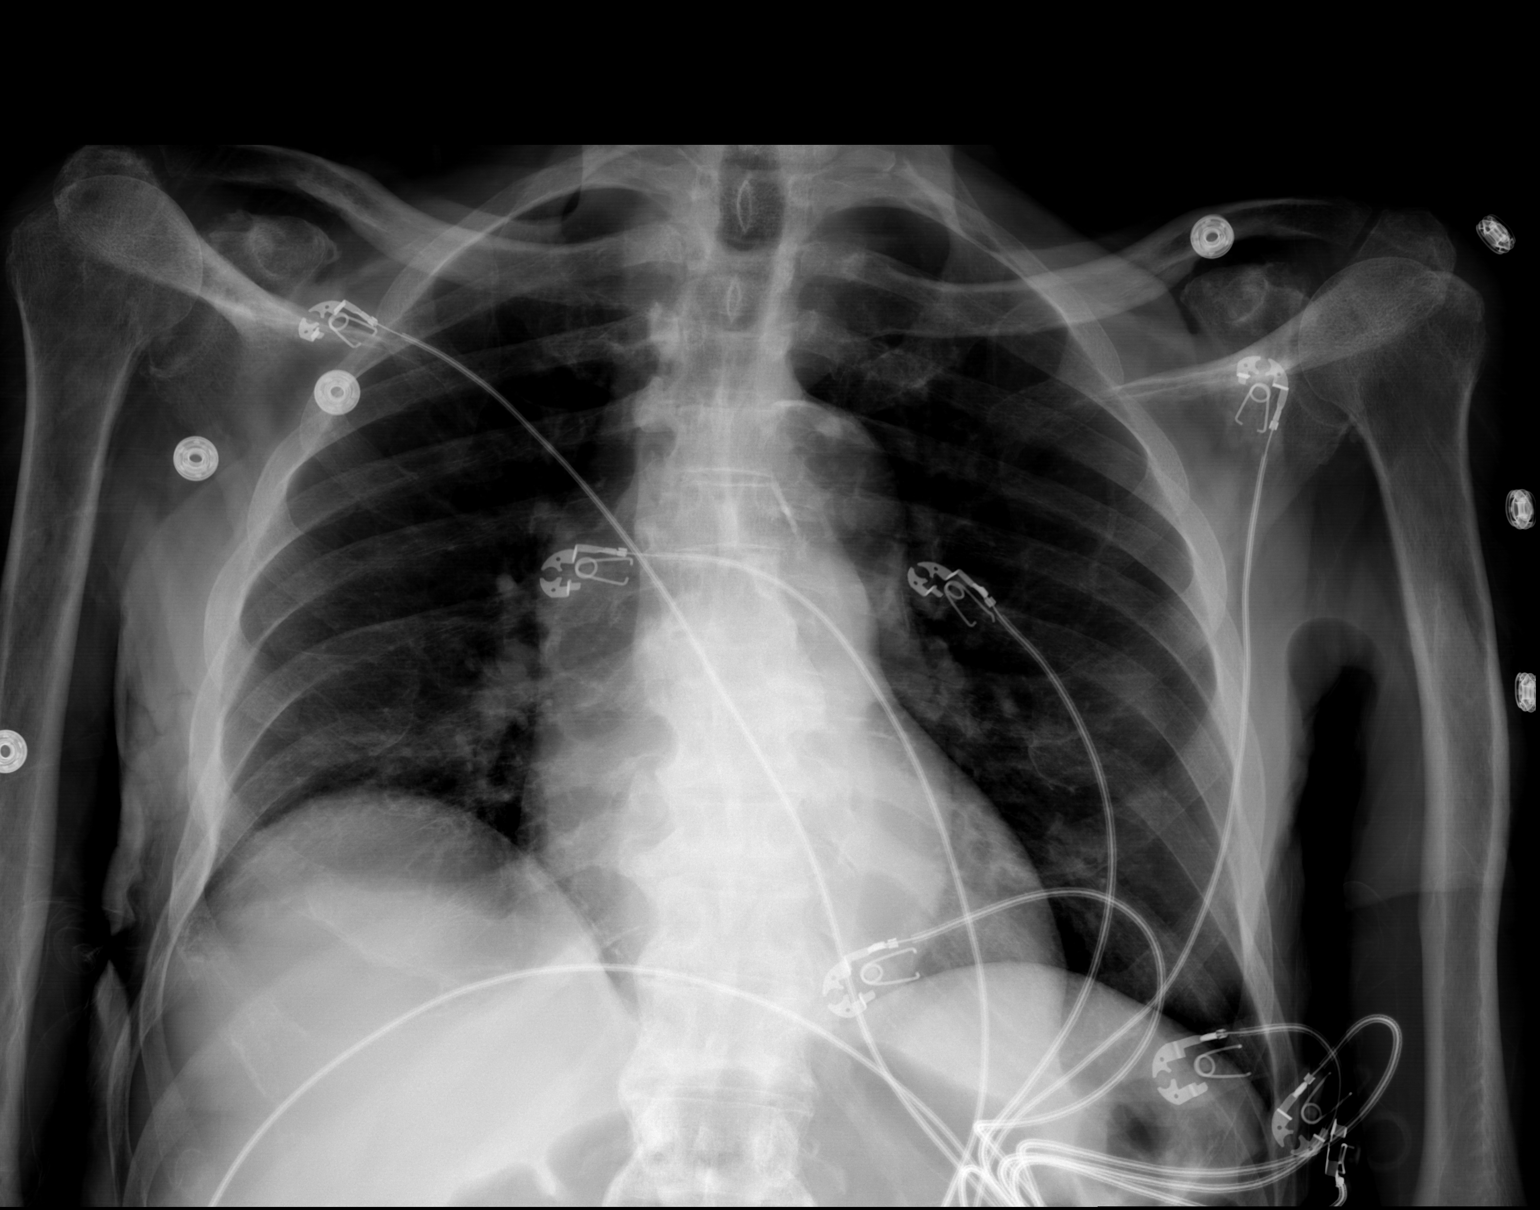

[w chest lat]
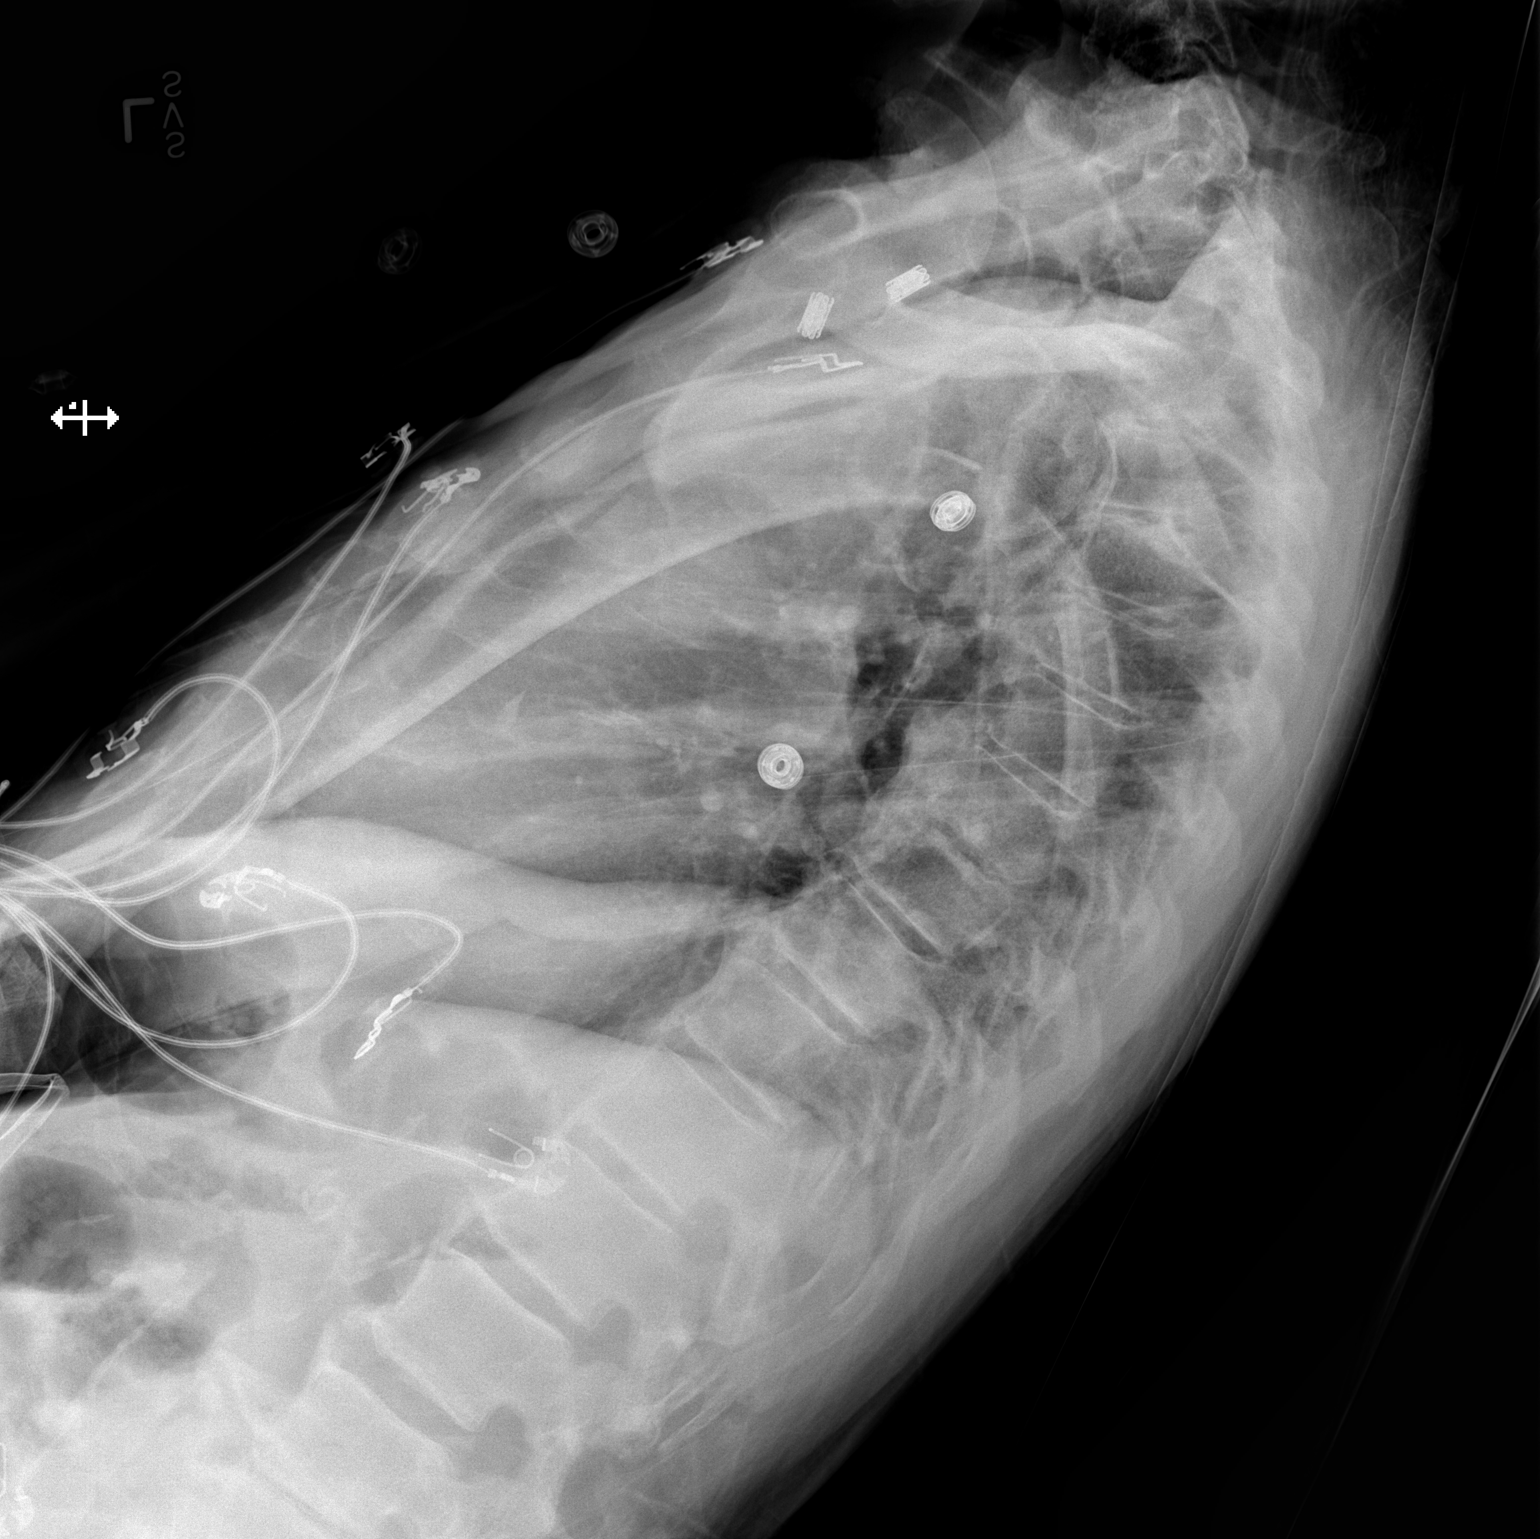

[2 of 2 positions shown; findings below may reference images not displayed]

FINDINGS: The heart size and mediastinal contours are within normal limits.
Both lungs are clear. The visualized skeletal structures are
unremarkable.
IMPRESSION: No active cardiopulmonary disease.
